# Patient Record
Sex: Female | Born: 1990 | Race: Black or African American | Hispanic: No | Marital: Single | Smoking: Current every day smoker
Health system: Southern US, Community
[De-identification: ages and names within clinical notes are randomized; demographics above are authoritative.]

## PROBLEM LIST (undated history)

## (undated) DIAGNOSIS — Z789 Other specified health status: Secondary | ICD-10-CM

## (undated) HISTORY — PX: OTHER SURGICAL HISTORY: SHX169

---

## 2017-11-03 ENCOUNTER — Emergency Department: Payer: Self-pay

## 2017-11-03 ENCOUNTER — Encounter: Payer: Self-pay | Admitting: Radiology

## 2017-11-03 ENCOUNTER — Inpatient Hospital Stay
Admission: EM | Admit: 2017-11-03 | Discharge: 2017-11-04 | DRG: 299 | Disposition: A | Discharge status: Home / Self Care | Payer: Self-pay | Attending: Internal Medicine | Admitting: Internal Medicine

## 2017-11-03 ENCOUNTER — Other Ambulatory Visit: Payer: Self-pay

## 2017-11-03 DIAGNOSIS — M7989 Other specified soft tissue disorders: Secondary | ICD-10-CM

## 2017-11-03 DIAGNOSIS — F17200 Nicotine dependence, unspecified, uncomplicated: Secondary | ICD-10-CM | POA: Diagnosis present

## 2017-11-03 DIAGNOSIS — I82451 Acute embolism and thrombosis of right peroneal vein: Secondary | ICD-10-CM

## 2017-11-03 DIAGNOSIS — M79604 Pain in right leg: Secondary | ICD-10-CM

## 2017-11-03 DIAGNOSIS — Z832 Family history of diseases of the blood and blood-forming organs and certain disorders involving the immune mechanism: Secondary | ICD-10-CM

## 2017-11-03 DIAGNOSIS — I2699 Other pulmonary embolism without acute cor pulmonale: Secondary | ICD-10-CM | POA: Diagnosis present

## 2017-11-03 DIAGNOSIS — I82491 Acute embolism and thrombosis of other specified deep vein of right lower extremity: Principal | ICD-10-CM | POA: Diagnosis present

## 2017-11-03 HISTORY — DX: Other specified health status: Z78.9

## 2017-11-03 LAB — COMPREHENSIVE METABOLIC PANEL
ALT: 19 U/L (ref 0–44)
AST: 20 U/L (ref 15–41)
Albumin: 3.5 g/dL (ref 3.5–5.0)
Alkaline Phosphatase: 44 U/L (ref 38–126)
Anion gap: 5 (ref 5–15)
BILIRUBIN TOTAL: 0.4 mg/dL (ref 0.3–1.2)
BUN: 6 mg/dL (ref 6–20)
CALCIUM: 8.8 mg/dL — AB (ref 8.9–10.3)
CO2: 24 mmol/L (ref 22–32)
Chloride: 108 mmol/L (ref 98–111)
Creatinine, Ser: 0.75 mg/dL (ref 0.44–1.00)
GFR calc Af Amer: >60 mL/min (ref >60)
Glucose, Bld: 92 mg/dL (ref 70–99)
Potassium: 3.4 mmol/L — ABNORMAL LOW (ref 3.5–5.1)
Sodium: 137 mmol/L (ref 135–145)
TOTAL PROTEIN: 6.9 g/dL (ref 6.5–8.1)

## 2017-11-03 LAB — URINALYSIS, COMPLETE (UACMP) WITH MICROSCOPIC
Bilirubin Urine: NEGATIVE
Glucose, UA: NEGATIVE mg/dL
KETONES UR: NEGATIVE mg/dL
Nitrite: NEGATIVE
Protein, ur: NEGATIVE mg/dL
SPECIFIC GRAVITY, URINE: 1.011 (ref 1.005–1.030)
pH: 7 (ref 5.0–8.0)

## 2017-11-03 LAB — CBC
HEMATOCRIT: 34.5 % — AB (ref 35.0–47.0)
HEMATOCRIT: 35.6 % (ref 35.0–47.0)
HEMOGLOBIN: 12.1 g/dL (ref 12.0–16.0)
Hemoglobin: 11.5 g/dL — ABNORMAL LOW (ref 12.0–16.0)
MCH: 28.9 pg (ref 26.0–34.0)
MCH: 29.4 pg (ref 26.0–34.0)
MCHC: 33.3 g/dL (ref 32.0–36.0)
MCHC: 33.9 g/dL (ref 32.0–36.0)
MCV: 86.6 fL (ref 80.0–100.0)
MCV: 86.6 fL (ref 80.0–100.0)
PLATELETS: 227 10*3/uL (ref 150–440)
Platelets: 214 10*3/uL (ref 150–440)
RBC: 3.98 MIL/uL (ref 3.80–5.20)
RBC: 4.11 MIL/uL (ref 3.80–5.20)
RDW: 13.1 % (ref 11.5–14.5)
RDW: 13.3 % (ref 11.5–14.5)
WBC: 6.5 10*3/uL (ref 3.6–11.0)
WBC: 6.9 10*3/uL (ref 3.6–11.0)

## 2017-11-03 LAB — CREATININE, SERUM
Creatinine, Ser: 0.71 mg/dL (ref 0.44–1.00)
GFR calc Af Amer: >60 mL/min (ref >60)
GFR calc non Af Amer: >60 mL/min (ref >60)

## 2017-11-03 LAB — PREGNANCY, URINE: PREG TEST UR: NEGATIVE

## 2017-11-03 MED ORDER — OXYCODONE-ACETAMINOPHEN 5-325 MG PO TABS: 1.0000 | ORAL_TABLET | Freq: Four times a day (QID) | ORAL | Status: DC | PRN

## 2017-11-03 MED ORDER — ACETAMINOPHEN 325 MG PO TABS: 650.0000 mg | ORAL_TABLET | Freq: Four times a day (QID) | ORAL | Status: DC | PRN

## 2017-11-03 MED ORDER — SODIUM CHLORIDE 0.9% FLUSH: 3.0000 mL | INTRAVENOUS | Status: DC | PRN

## 2017-11-03 MED ORDER — ONDANSETRON HCL 4 MG PO TABS: 4.0000 mg | ORAL_TABLET | Freq: Four times a day (QID) | ORAL | Status: DC | PRN

## 2017-11-03 MED ORDER — ENOXAPARIN SODIUM 100 MG/ML ~~LOC~~ SOLN
1.0000 mg/kg | Freq: Once | SUBCUTANEOUS | Status: AC
  Administered 2017-11-03: 85 mg via SUBCUTANEOUS
  Filled 2017-11-03: qty 0.85

## 2017-11-03 MED ORDER — ENOXAPARIN SODIUM 40 MG/0.4ML ~~LOC~~ SOLN: 40.0000 mg | SUBCUTANEOUS | Status: DC

## 2017-11-03 MED ORDER — IBUPROFEN 400 MG PO TABS: 400.0000 mg | ORAL_TABLET | Freq: Four times a day (QID) | ORAL | Status: DC | PRN

## 2017-11-03 MED ORDER — KETOROLAC TROMETHAMINE 15 MG/ML IJ SOLN
15.0000 mg | Freq: Four times a day (QID) | INTRAMUSCULAR | Status: DC | PRN
  Administered 2017-11-03 – 2017-11-04 (×2): 15 mg via INTRAVENOUS
  Filled 2017-11-03 (×2): qty 1

## 2017-11-03 MED ORDER — SODIUM CHLORIDE 0.9% FLUSH
3.0000 mL | Freq: Two times a day (BID) | INTRAVENOUS | Status: DC
  Administered 2017-11-03 – 2017-11-04 (×3): 3 mL via INTRAVENOUS

## 2017-11-03 MED ORDER — IOHEXOL 350 MG/ML SOLN
75.0000 mL | Freq: Once | INTRAVENOUS | Status: AC | PRN
  Administered 2017-11-03: 75 mL via INTRAVENOUS

## 2017-11-03 MED ORDER — ACETAMINOPHEN 650 MG RE SUPP: 650.0000 mg | Freq: Four times a day (QID) | RECTAL | Status: DC | PRN

## 2017-11-03 MED ORDER — ENOXAPARIN SODIUM 100 MG/ML ~~LOC~~ SOLN
1.0000 mg/kg | Freq: Two times a day (BID) | SUBCUTANEOUS | Status: DC
  Administered 2017-11-03 – 2017-11-04 (×2): 85 mg via SUBCUTANEOUS
  Filled 2017-11-03 (×3): qty 1

## 2017-11-03 MED ORDER — ONDANSETRON HCL 4 MG/2ML IJ SOLN: 4.0000 mg | Freq: Four times a day (QID) | INTRAMUSCULAR | Status: DC | PRN

## 2017-11-03 MED ORDER — SODIUM CHLORIDE 0.9 % IV SOLN: 250.0000 mL | INTRAVENOUS | Status: DC | PRN

## 2017-11-03 NOTE — ED Triage Notes (Signed)
Patient reports symptoms for "couple" days but worse tonight.  Reports pain and swelling to right calf area.  Patient is able to ambulate without assistance, but with slow gait.

## 2017-11-03 NOTE — H&P (Signed)
[employer] - [place] at [facility]   PATIENT NAME: Angelica Lara    MR#:  [id number]  DATE OF BIRTH:  1991-02-18  DATE OF ADMISSION:  11/03/2017  PRIMARY CARE PHYSICIAN: Patient, No Pcp Per   REQUESTING/REFERRING PHYSICIAN: Darci Current, MD   CHIEF COMPLAINT:   Chief Complaint  Patient presents with  . Leg Swelling    HISTORY OF PRESENT ILLNESS: Angelica Lara  is a 27 y.o. female with no medical history presenting with 1-1/2-week of right calf pain and right lower extremity swelling.  Patient also was having some chest pain and back pain ongoing for a very long time.  Patient was evaluated in the ED and had a lower extremity Doppler which showed DVT in the right lower extremity.  Patient does report that her mother had some sort of clotting problem.  She is unable to give me details on that.     PAST MEDICAL HISTORY:   Past Medical History:  Diagnosis Date  . Patient denies medical problems     PAST SURGICAL HISTORY:  Past Surgical History:  Procedure Laterality Date  . patient denies      SOCIAL HISTORY:  Social History   Tobacco Use  . Smoking status: Current Every Day Smoker  . Smokeless tobacco: Never Used  Substance Use Topics  . Alcohol use: Not Currently    FAMILY HISTORY:  Family History  Problem Relation Age of Onset  . Clotting disorder Mother     DRUG ALLERGIES: No Known Allergies  REVIEW OF SYSTEMS:   CONSTITUTIONAL: No fever, fatigue or weakness.  EYES: No blurred or double vision.  EARS, NOSE, AND THROAT: No tinnitus or ear pain.  RESPIRATORY: No cough, shortness of breath, wheezing or hemoptysis.  CARDIOVASCULAR: No chest pain, orthopnea, edema.  GASTROINTESTINAL: No nausea, vomiting, diarrhea or abdominal pain.  GENITOURINARY: No dysuria, hematuria.  ENDOCRINE: No polyuria, nocturia,  HEMATOLOGY: No anemia, easy bruising or bleeding SKIN: No rash or lesion. MUSCULOSKELETAL: No joint pain or arthritis.   Right lower extremity swelling NEUROLOGIC: No tingling, numbness, weakness.  PSYCHIATRY: No anxiety or depression.   MEDICATIONS AT HOME:  Prior to Admission medications   Not on File      PHYSICAL EXAMINATION:   VITAL SIGNS: Blood pressure 125/79, pulse 81, temperature 99.5 F (37.5 C), temperature source Oral, resp. rate 16, height 5\' 5"  (1.651 m), weight 86.2 kg (190 lb), last menstrual period 10/27/2017, SpO2 100 %.  GENERAL:  27 y.o.-year-old patient lying in the bed with no acute distress.  EYES: Pupils equal, round, reactive to light and accommodation. No scleral icterus. Extraocular muscles intact.  HEENT: Head atraumatic, normocephalic. Oropharynx and nasopharynx clear.  NECK:  Supple, no jugular venous distention. No thyroid enlargement, no tenderness.  LUNGS: Normal breath sounds bilaterally, no wheezing, rales,rhonchi or crepitation. No use of accessory muscles of respiration.  CARDIOVASCULAR: S1, S2 normal. No murmurs, rubs, or gallops.  ABDOMEN: Soft, nontender, nondistended. Bowel sounds present. No organomegaly or mass.  EXTREMITIES: right lower swelling NEUROLOGIC: Cranial nerves II through XII are intact. Muscle strength 5/5 in all extremities. Sensation intact. Gait not checked.  PSYCHIATRIC: The patient is alert and oriented x 3.  SKIN: No obvious rash, lesion, or ulcer.   LABORATORY PANEL:   CBC Recent Labs  Lab 11/03/17 0510  WBC 6.5  HGB 11.5*  HCT 34.5*  PLT 227  MCV 86.6  MCH 28.9  MCHC 33.3  RDW 13.1   ------------------------------------------------------------------------------------------------------------------  Chemistries  Recent  Labs  Lab 11/03/17 0510  NA 137  K 3.4*  CL 108  CO2 24  GLUCOSE 92  BUN 6  CREATININE 0.75  CALCIUM 8.8*  AST 20  ALT 19  ALKPHOS 44  BILITOT 0.4   ------------------------------------------------------------------------------------------------------------------ estimated creatinine clearance is  116.6 mL/min (by C-G formula based on SCr of 0.75 mg/dL). ------------------------------------------------------------------------------------------------------------------ No results for input(s): TSH, T4TOTAL, T3FREE, THYROIDAB in the last 72 hours.  Invalid input(s): FREET3   Coagulation profile No results for input(s): INR, PROTIME in the last 168 hours. ------------------------------------------------------------------------------------------------------------------- No results for input(s): DDIMER in the last 72 hours. -------------------------------------------------------------------------------------------------------------------  Cardiac Enzymes No results for input(s): CKMB, TROPONINI, MYOGLOBIN in the last 168 hours.  Invalid input(s): CK ------------------------------------------------------------------------------------------------------------------ Invalid input(s): POCBNP  ---------------------------------------------------------------------------------------------------------------  Urinalysis    Component Value Date/Time   COLORURINE YELLOW (A) 11/03/2017 0510   APPEARANCEUR CLOUDY (A) 11/03/2017 0510   LABSPEC 1.011 11/03/2017 0510   PHURINE 7.0 11/03/2017 0510   GLUCOSEU NEGATIVE 11/03/2017 0510   HGBUR SMALL (A) 11/03/2017 0510   BILIRUBINUR NEGATIVE 11/03/2017 0510   KETONESUR NEGATIVE 11/03/2017 0510   PROTEINUR NEGATIVE 11/03/2017 0510   NITRITE NEGATIVE 11/03/2017 0510   LEUKOCYTESUR LARGE (A) 11/03/2017 0510     RADIOLOGY: Ct Angio Chest Pe W And/or Wo Contrast  Result Date: 11/03/2017 CLINICAL DATA:  Right lower extremity DVT.  Chest pain. EXAM: CT ANGIOGRAPHY CHEST WITH CONTRAST TECHNIQUE: Multidetector CT imaging of the chest was performed using the standard protocol during bolus administration of intravenous contrast. Multiplanar CT image reconstructions and MIPs were obtained to evaluate the vascular anatomy. CONTRAST:  75mL OMNIPAQUE IOHEXOL  350 MG/ML SOLN COMPARISON:  None. FINDINGS: Cardiovascular: --Pulmonary arteries: Contrast injection is sufficient to demonstrate satisfactory opacification of the pulmonary arteries to the segmental level.Small right lower lobe segmental pulmonary embolus in the proximal lateral basal and posterior basal segmental arteries. No right heart strain. The main pulmonary artery is within normal limits for size. --Aorta: Limited opacification of the aorta due to bolus timing optimization for the pulmonary arteries. Conventional 3 vessel aortic branching pattern. The aortic course and caliber are normal. There is no aortic atherosclerosis. --Heart: Normal size. No pericardial effusion. Mediastinum/Nodes: No mediastinal, hilar or axillary lymphadenopathy. The visualized thyroid and thoracic esophageal course are unremarkable. Lungs/Pleura: No pulmonary nodules or masses. No pleural effusion or pneumothorax. No focal airspace consolidation. No focal pleural abnormality. Upper Abdomen: Contrast bolus timing is not optimized for evaluation of the abdominal organs. Within this limitation, the visualized organs of the upper abdomen are normal. Musculoskeletal: No chest wall abnormality. No acute or significant osseous findings. Review of the MIP images confirms the above findings. IMPRESSION: 1. Small right lower lobe pulmonary embolus within the lateral basal and posterior basal segmental branches. No right heart strain. 2. No other acute or focal thoracic abnormality. Critical Value/emergent results were called by telephone at the time of interpretation on 11/03/2017 at 6:18 am to Dr. Bayard Males , who verbally acknowledged these results. Electronically Signed   By: Deatra Robinson M.D.   On: 11/03/2017 06:19   US Venous Img Lower Unilateral Right  Result Date: 11/03/2017 CLINICAL DATA:  27 year old female with right lower extremity pain and swelling. EXAM: Right LOWER EXTREMITY VENOUS DOPPLER ULTRASOUND TECHNIQUE:  Gray-scale sonography with graded compression, as well as color Doppler and duplex ultrasound were performed to evaluate the lower extremity deep venous systems from the level of the common femoral vein and including the common femoral, femoral, profunda femoral, popliteal and calf  veins including the posterior tibial, peroneal and gastrocnemius veins when visible. The superficial great saphenous vein was also interrogated. Spectral Doppler was utilized to evaluate flow at rest and with distal augmentation maneuvers in the common femoral, femoral and popliteal veins. COMPARISON:  None. FINDINGS: Contralateral Common Femoral Vein: Respiratory phasicity is normal and symmetric with the symptomatic side. No evidence of thrombus. Normal compressibility. Common Femoral Vein: No evidence of thrombus. Normal compressibility, respiratory phasicity and response to augmentation. Saphenofemoral Junction: No evidence of thrombus. Normal compressibility and flow on color Doppler imaging. Profunda Femoral Vein: No evidence of thrombus. Normal compressibility and flow on color Doppler imaging. Femoral Vein: No evidence of thrombus. Normal compressibility, respiratory phasicity and response to augmentation. Popliteal Vein: No evidence of thrombus. Normal compressibility, respiratory phasicity and response to augmentation. Calf Veins: There is occlusive thrombus in the peroneal vein with noncompression of the vessel. Superficial Great Saphenous Vein: No evidence of thrombus. Normal compressibility. Venous Reflux:  None. Other Findings:  None. IMPRESSION: Occlusive DVT involving the peroneal vein. These results were called by telephone at the time of interpretation on 11/03/2017 at 3:55 am to Dr. Lucrezia EuropeALLISON WEBSTER , who verbally acknowledged these results. Electronically Signed   By: Elgie CollardArash  Radparvar M.D.   On: 11/03/2017 03:57    EKG: Orders placed or performed during the hospital encounter of 11/03/17  . EKG 12-Lead  . EKG  12-Lead  . ED EKG  . ED EKG    IMPRESSION AND PLAN: Pt is 27 y.o with no medical history with family history of clots presenting with swelling of the lower extremity as well as chest pain 1.  Acute pulmonary embolism and DVT we will treat patient with Lovenox and transition to oral Xarelto or Eliquis tomorrow I will check a hypercoagulable work-up in light of patient's family history of clots.  2.  Nicotine abuse 4 minutes spent smoking cessation provided strongly recommended that she stop smoking especially in light of an acute pulmonary embolism    All the records are reviewed and case discussed with ED provider. Management plans discussed with the patient, family and they are in agreement.  CODE STATUS:    TOTAL TIME TAKING CARE OF THIS PATIENT: 55 minutes.    Auburn BilberryShreyang Graysen Woodyard M.D on 11/03/2017 at 7:53 AM  Between 7am to 6pm - Pager - [phone]  After 6pm go to [email or web address] - password [email or web address] [facility]  [employer] Office  [phone]  CC: Primary care physician; Patient, No Pcp Per

## 2017-11-03 NOTE — ED Notes (Signed)
Pt transported to CT at this time.

## 2017-11-03 NOTE — ED Provider Notes (Addendum)
[facility] Emergency Department Provider Note   First MD Initiated Contact with Patient 11/03/17 [phone]     (approximate)  I have reviewed the triage vital signs and the nursing notes.   HISTORY  Chief Complaint Leg Swelling   HPI Angelica Lara is a 27 y.o. female presents to the emergency department with 1-1/2-week history of right calf pain and right lower externally swelling.  Patient states however that the pain is increased in intensity over the past 3 days current pain score 8 out of 10.  Patient denies any aggravating or alleviating factors.  Patient does admit to a familial history of blood clots stating that her mother had multiple strokes in her 3320s.  Patient does smoke no birth control use.  Patient admits to chest and back pain however no dyspnea.  Patient states that she has been having chest pain for "a very long time".   Past medical history None There are no active problems to display for this patient.     Prior to Admission medications   Not on File    Allergies No known allergies No family history on file.  Social History Social History   Tobacco Use  . Smoking status: Not on file  Substance Use Topics  . Alcohol use: Not on file  . Drug use: Not on file    Review of Systems Constitutional: No fever/chills Eyes: No visual changes. ENT: No sore throat. Cardiovascular: Denies chest pain. Respiratory: Denies shortness of breath. Gastrointestinal: No abdominal pain.  No nausea, no vomiting.  No diarrhea.  No constipation. Genitourinary: Negative for dysuria. Musculoskeletal: Negative for neck pain.  Negative for back pain.  Positive for right calf pain Integumentary: Negative for rash. Neurological: Negative for headaches, focal weakness or numbness.   ____________________________________________   PHYSICAL EXAM:  VITAL SIGNS: ED Triage Vitals  Enc Vitals Group     BP 11/03/17 0216 125/75     Pulse Rate  11/03/17 0216 93     Resp 11/03/17 0216 18     Temp 11/03/17 0216 99.5 F (37.5 C)     Temp Source 11/03/17 0216 Oral     SpO2 11/03/17 0216 100 %     Weight 11/03/17 0214 86.2 kg (190 lb)     Height 11/03/17 0214 1.651 m (5\' 5" )     Head Circumference --      Peak Flow --      Pain Score --      Pain Loc --      Pain Edu? --      Excl. in GC? --     Constitutional: Alert and oriented. Well appearing and in no acute distress. Eyes: Conjunctivae are normal.  Head: Atraumatic. Mouth/Throat: Mucous membranes are moist. Oropharynx non-erythematous. Neck: No stridor.   Cardiovascular: Normal rate, regular rhythm. Good peripheral circulation. Grossly normal heart sounds. Respiratory: Normal respiratory effort.  No retractions. Lungs CTAB. Gastrointestinal: Soft and nontender. No distention.  Musculoskeletal: 1+ nonpitting right lower extremity edema.  Right calf pain with palpation.  Palpable PT DP pulses equal bilaterally. Neurologic:  Normal speech and language. No gross focal neurologic deficits are appreciated.  Skin:  Skin is warm, dry and intact. No rash noted. Psychiatric: Mood and affect are normal. Speech and behavior are normal.  ____________________________________________   LABS (all labs ordered are listed, but only abnormal results are displayed)  Labs Reviewed  CBC - Abnormal; Notable for the following components:      Result Value  Hemoglobin 11.5 (*)    HCT 34.5 (*)    All other components within normal limits  COMPREHENSIVE METABOLIC PANEL - Abnormal; Notable for the following components:   Potassium 3.4 (*)    Calcium 8.8 (*)    All other components within normal limits  URINALYSIS, COMPLETE (UACMP) WITH MICROSCOPIC - Abnormal; Notable for the following components:   Color, Urine YELLOW (*)    APPearance CLOUDY (*)    Hgb urine dipstick SMALL (*)    Leukocytes, UA LARGE (*)    Bacteria, UA RARE (*)    All other components within normal limits    PREGNANCY, URINE    RADIOLOGY I, [place] N Mossie Gilder, personally viewed and evaluated these images (plain radiographs) as part of my medical decision making, as well as reviewing the written report by the radiologist.  ED MD interpretation: Occlusive DVT right peroneal vein.  Official radiology report(s): US Venous Img Lower Unilateral Right  Result Date: 11/03/2017 CLINICAL DATA:  27 year old female with right lower extremity pain and swelling. EXAM: Right LOWER EXTREMITY VENOUS DOPPLER ULTRASOUND TECHNIQUE: Gray-scale sonography with graded compression, as well as color Doppler and duplex ultrasound were performed to evaluate the lower extremity deep venous systems from the level of the common femoral vein and including the common femoral, femoral, profunda femoral, popliteal and calf veins including the posterior tibial, peroneal and gastrocnemius veins when visible. The superficial great saphenous vein was also interrogated. Spectral Doppler was utilized to evaluate flow at rest and with distal augmentation maneuvers in the common femoral, femoral and popliteal veins. COMPARISON:  None. FINDINGS: Contralateral Common Femoral Vein: Respiratory phasicity is normal and symmetric with the symptomatic side. No evidence of thrombus. Normal compressibility. Common Femoral Vein: No evidence of thrombus. Normal compressibility, respiratory phasicity and response to augmentation. Saphenofemoral Junction: No evidence of thrombus. Normal compressibility and flow on color Doppler imaging. Profunda Femoral Vein: No evidence of thrombus. Normal compressibility and flow on color Doppler imaging. Femoral Vein: No evidence of thrombus. Normal compressibility, respiratory phasicity and response to augmentation. Popliteal Vein: No evidence of thrombus. Normal compressibility, respiratory phasicity and response to augmentation. Calf Veins: There is occlusive thrombus in the peroneal vein with noncompression of the vessel.  Superficial Great Saphenous Vein: No evidence of thrombus. Normal compressibility. Venous Reflux:  None. Other Findings:  None. IMPRESSION: Occlusive DVT involving the peroneal vein. These results were called by telephone at the time of interpretation on 11/03/2017 at 3:55 am to Dr. Lucrezia Europe , who verbally acknowledged these results. Electronically Signed   By: Elgie Collard M.D.   On: 11/03/2017 03:57    ____________________________________________  ED ECG REPORT I, [place] N Barnard Sharps, the attending physician, personally viewed and interpreted this ECG.   Date: 11/03/2017  EKG Time: 6:25 AM  Rate: 90  Rhythm: Normal sinus rhythm  Axis: Normal  Intervals: Normal  ST&T Change: None   Procedures   ____________________________________________   INITIAL IMPRESSION / ASSESSMENT AND PLAN / ED COURSE  As part of my medical decision making, I reviewed the following data within the electronic MEDICAL RECORD NUMBER   27 year old female presenting with above-stated history and physical exam with concern for possible right lower extremity DVT and as such ultrasound was performed which revealed an occlusive right peroneal vein DVT.  Given patient's chest pain CT angiogram of the chest was performed to evaluate for pulmonary emboli.  CT of the chest revealed evidence of a right lower lobe segmental pulmonary emboli.  Patient given Lovenox 1  mg/kg.  Patient discussed with Dr. Sheryle Hail for hospital admission.     ____________________________________________  FINAL CLINICAL IMPRESSION(S) / ED DIAGNOSES  Final diagnoses:  Acute deep vein thrombosis (DVT) of right peroneal vein (HCC)  Acute pulmonary embolism without acute cor pulmonale, unspecified pulmonary embolism type (HCC)     MEDICATIONS GIVEN DURING THIS VISIT:  Medications - No data to display   ED Discharge Orders    None       Note:  This document was prepared using Dragon voice recognition software and may include  unintentional dictation errors.    Darci Current, MD 11/03/17 [id number]    Darci Current, MD 11/28/17 [phone]

## 2017-11-03 NOTE — Consult Note (Signed)
ANTICOAGULATION CONSULT NOTE - Initial Consult  Pharmacy Consult for enoxaparin Indication: pulmonary embolus and DVT  No Known Allergies  Patient Measurements: Height: 5\' 5"  (165.1 cm) Weight: 190 lb (86.2 kg) IBW/kg (Calculated) : 57 Heparin Dosing Weight:   Vital Signs: Temp: 99.5 F (37.5 C) (06/30 0216) Temp Source: Oral (06/30 0216) BP: 125/79 (06/30 0630) Pulse Rate: 81 (06/30 0630)  Labs: Recent Labs    11/03/17 0510  HGB 11.5*  HCT 34.5*  PLT 227  CREATININE 0.75    Estimated Creatinine Clearance: 116.6 mL/min (by C-G formula based on SCr of 0.75 mg/dL).   Medical History: Past Medical History:  Diagnosis Date  . Patient denies medical problems     Medications:  Scheduled:   Assessment: Patient is a 27 year old female found to have DVT and small PE. Pt given lovenox in the ED. Pharmacy consulted to continue lovenox. CBC WNL.  Goal of Therapy:   Monitor platelets by anticoagulation protocol: Yes   Plan:  Lovenox 1mg /kg q 12 hr- next dose @ 1830 tonight  Angelica Lara, Pharm.D, BCPS Clinical Pharmacist 11/03/2017,7:57 AM

## 2017-11-03 NOTE — ED Notes (Addendum)
ED MD states it is okay for pt to get up and walk, pt was unhooked from monitors and ambulatory to bathroom in room.

## 2017-11-04 LAB — CBC
HCT: 37.8 % (ref 35.0–47.0)
Hemoglobin: 12.8 g/dL (ref 12.0–16.0)
MCH: 29.2 pg (ref 26.0–34.0)
MCHC: 33.8 g/dL (ref 32.0–36.0)
MCV: 86.5 fL (ref 80.0–100.0)
PLATELETS: 223 10*3/uL (ref 150–440)
RBC: 4.37 MIL/uL (ref 3.80–5.20)
RDW: 13.2 % (ref 11.5–14.5)
WBC: 4.5 10*3/uL (ref 3.6–11.0)

## 2017-11-04 LAB — HIV ANTIBODY (ROUTINE TESTING W REFLEX): HIV Screen 4th Generation wRfx: NONREACTIVE

## 2017-11-04 LAB — LUPUS ANTICOAGULANT PANEL
DRVVT: 57.1 s — AB (ref 0.0–47.0)
PTT LA: 43.8 s (ref 0.0–51.9)

## 2017-11-04 LAB — BASIC METABOLIC PANEL
Anion gap: 6 (ref 5–15)
BUN: 8 mg/dL (ref 6–20)
CALCIUM: 9.1 mg/dL (ref 8.9–10.3)
CO2: 26 mmol/L (ref 22–32)
Chloride: 106 mmol/L (ref 98–111)
Creatinine, Ser: 0.79 mg/dL (ref 0.44–1.00)
GLUCOSE: 86 mg/dL (ref 70–99)
Potassium: 4.1 mmol/L (ref 3.5–5.1)
SODIUM: 138 mmol/L (ref 135–145)

## 2017-11-04 LAB — PROTEIN S ACTIVITY: Protein S Activity: 105 % (ref 63–140)

## 2017-11-04 LAB — HOMOCYSTEINE: HOMOCYSTEINE-NORM: 9.7 umol/L (ref 0.0–15.0)

## 2017-11-04 LAB — PROTEIN S, TOTAL: Protein S Ag, Total: 88 % (ref 60–150)

## 2017-11-04 LAB — DRVVT MIX: DRVVT MIX: 40.4 s (ref 0.0–47.0)

## 2017-11-04 LAB — PROTEIN C ACTIVITY: Protein C Activity: 143 % (ref 73–180)

## 2017-11-04 LAB — ANTITHROMBIN III: ANTITHROMB III FUNC: 95 % (ref 75–120)

## 2017-11-04 MED ORDER — APIXABAN 5 MG PO TABS: 10.0000 mg | ORAL_TABLET | Freq: Two times a day (BID) | ORAL | 0 refills | Status: AC

## 2017-11-04 MED ORDER — APIXABAN 5 MG PO TABS: 5.0000 mg | ORAL_TABLET | Freq: Two times a day (BID) | ORAL | 2 refills | Status: DC

## 2017-11-04 MED ORDER — APIXABAN 5 MG PO TABS: 10.0000 mg | ORAL_TABLET | Freq: Two times a day (BID) | ORAL | 0 refills | Status: DC

## 2017-11-04 MED ORDER — APIXABAN 5 MG PO TABS: 5.0000 mg | ORAL_TABLET | Freq: Two times a day (BID) | ORAL | 2 refills | Status: AC

## 2017-11-04 MED ORDER — OXYCODONE-ACETAMINOPHEN 5-325 MG PO TABS: 1.0000 | ORAL_TABLET | Freq: Three times a day (TID) | ORAL | 0 refills | Status: AC | PRN

## 2017-11-04 NOTE — Discharge Summary (Signed)
[facility] -  at [facility]  Angelica Lara, [place]27 y.o., DOB 1990/07/01, MRN [id number]. Admission date: 11/03/2017 Discharge Date 11/04/2017 Primary MD Patient, No Pcp Per Admitting Physician Auburn BilberryShreyang Sanaz Scarlett, MD  Admission Diagnosis  Right leg pain [M79.604] Right leg swelling [M79.89] Acute deep vein thrombosis (DVT) of right peroneal vein (HCC) [I82.491] Acute pulmonary embolism without acute cor pulmonale, unspecified pulmonary embolism type [facility](HCC) [I26.99]  Discharge Diagnosis   Active Problems: Acute pulmonary DVT Nicotine abuse     Hospital Course Angelica Lara  is a 27 y.o. female with no medical history presenting with 1-1/2-week of right calf pain and right lower extremity swelling.  Patient also was having some chest pain and back pain ongoing for a very long time.  Patient was evaluated in the ED and had a lower extremity Doppler which showed DVT in the right lower extremity.  Patient does report that her mother had some sort of clotting problem.    Patient was admitted and started on Lovenox.  Patient had hypercoagulable work-up which is currently pending.  She will need to follow-up with hematology as outpatient.  Patient is unable to afford her Eliquis I have contacted case manager who should be helping patient with this discharge medication.            Consults  None  Significant Tests:  See full reports for all details    Ct Angio Chest Pe W And/or Wo Contrast  Result Date: 11/03/2017 CLINICAL DATA:  Right lower extremity DVT.  Chest pain. EXAM: CT ANGIOGRAPHY CHEST WITH CONTRAST TECHNIQUE: Multidetector CT imaging of the chest was performed using the standard protocol during bolus administration of intravenous contrast. Multiplanar CT image reconstructions and MIPs were obtained to evaluate the vascular anatomy. CONTRAST:  75mL OMNIPAQUE IOHEXOL 350 MG/ML SOLN COMPARISON:  None. FINDINGS: Cardiovascular: --Pulmonary arteries:  Contrast injection is sufficient to demonstrate satisfactory opacification of the pulmonary arteries to the segmental level.Small right lower lobe segmental pulmonary embolus in the proximal lateral basal and posterior basal segmental arteries. No right heart strain. The main pulmonary artery is within normal limits for size. --Aorta: Limited opacification of the aorta due to bolus timing optimization for the pulmonary arteries. Conventional 3 vessel aortic branching pattern. The aortic course and caliber are normal. There is no aortic atherosclerosis. --Heart: Normal size. No pericardial effusion. Mediastinum/Nodes: No mediastinal, hilar or axillary lymphadenopathy. The visualized thyroid and thoracic esophageal course are unremarkable. Lungs/Pleura: No pulmonary nodules or masses. No pleural effusion or pneumothorax. No focal airspace consolidation. No focal pleural abnormality. Upper Abdomen: Contrast bolus timing is not optimized for evaluation of the abdominal organs. Within this limitation, the visualized organs of the upper abdomen are normal. Musculoskeletal: No chest wall abnormality. No acute or significant osseous findings. Review of the MIP images confirms the above findings. IMPRESSION: 1. Small right lower lobe pulmonary embolus within the lateral basal and posterior basal segmental branches. No right heart strain. 2. No other acute or focal thoracic abnormality. Critical Value/emergent results were called by telephone at the time of interpretation on 11/03/2017 at 6:18 am to Dr. Bayard MalesANDOLPH BROWN , who verbally acknowledged these results. Electronically Signed   By: Deatra RobinsonKevin  Herman M.D.   On: 11/03/2017 06:19   [place] Venous Img Lower Unilateral Right  Result Date: 11/03/2017 CLINICAL DATA:  27 year old female with right lower extremity pain and swelling. EXAM: Right LOWER EXTREMITY VENOUS DOPPLER ULTRASOUND TECHNIQUE: Gray-scale sonography with graded compression, as well as color Doppler and duplex  ultrasound were  performed to evaluate the lower extremity deep venous systems from the level of the common femoral vein and including the common femoral, femoral, profunda femoral, popliteal and calf veins including the posterior tibial, peroneal and gastrocnemius veins when visible. The superficial great saphenous vein was also interrogated. Spectral Doppler was utilized to evaluate flow at rest and with distal augmentation maneuvers in the common femoral, femoral and popliteal veins. COMPARISON:  None. FINDINGS: Contralateral Common Femoral Vein: Respiratory phasicity is normal and symmetric with the symptomatic side. No evidence of thrombus. Normal compressibility. Common Femoral Vein: No evidence of thrombus. Normal compressibility, respiratory phasicity and response to augmentation. Saphenofemoral Junction: No evidence of thrombus. Normal compressibility and flow on color Doppler imaging. Profunda Femoral Vein: No evidence of thrombus. Normal compressibility and flow on color Doppler imaging. Femoral Vein: No evidence of thrombus. Normal compressibility, respiratory phasicity and response to augmentation. Popliteal Vein: No evidence of thrombus. Normal compressibility, respiratory phasicity and response to augmentation. Calf Veins: There is occlusive thrombus in the peroneal vein with noncompression of the vessel. Superficial Great Saphenous Vein: No evidence of thrombus. Normal compressibility. Venous Reflux:  None. Other Findings:  None. IMPRESSION: Occlusive DVT involving the peroneal vein. These results were called by telephone at the time of interpretation on 11/03/2017 at 3:55 am to Dr. Lucrezia Europe , who verbally acknowledged these results. Electronically Signed   By: Elgie Collard M.D.   On: 11/03/2017 03:57       Today   Subjective:   Angelica Lara feeling better denies any complaints Objective:   Blood pressure (!) 104/55, pulse 66, temperature 98.2 F (36.8 C), resp. rate 18,  height 5\' 5"  (1.651 m), weight 86.2 kg (190 lb), last menstrual period 10/27/2017, SpO2 100 %.  .  Intake/Output Summary (Last 24 hours) at 11/04/2017 1401 Last data filed at 11/04/2017 1015 Gross per 24 hour  Intake 480 ml  Output -  Net 480 ml    Exam VITAL SIGNS: Blood pressure (!) 104/55, pulse 66, temperature 98.2 F (36.8 C), resp. rate 18, height 5\' 5"  (1.651 m), weight 86.2 kg (190 lb), last menstrual period 10/27/2017, SpO2 100 %.  GENERAL:  27 y.o.-year-old patient lying in the bed with no acute distress.  EYES: Pupils equal, round, reactive to light and accommodation. No scleral icterus. Extraocular muscles intact.  HEENT: Head atraumatic, normocephalic. Oropharynx and nasopharynx clear.  NECK:  Supple, no jugular venous distention. No thyroid enlargement, no tenderness.  LUNGS: Normal breath sounds bilaterally, no wheezing, rales,rhonchi or crepitation. No use of accessory muscles of respiration.  CARDIOVASCULAR: S1, S2 normal. No murmurs, rubs, or gallops.  ABDOMEN: Soft, nontender, nondistended. Bowel sounds present. No organomegaly or mass.  EXTREMITIES: No pedal edema, cyanosis, or clubbing.  NEUROLOGIC: Cranial nerves II through XII are intact. Muscle strength 5/5 in all extremities. Sensation intact. Gait not checked.  PSYCHIATRIC: The patient is alert and oriented x 3.  SKIN: No obvious rash, lesion, or ulcer.   Data Review     CBC w Diff:  Lab Results  Component Value Date   WBC 4.5 11/04/2017   HGB 12.8 11/04/2017   HCT 37.8 11/04/2017   PLT 223 11/04/2017   CMP:  Lab Results  Component Value Date   NA 138 11/04/2017   K 4.1 11/04/2017   CL 106 11/04/2017   CO2 26 11/04/2017   BUN 8 11/04/2017   CREATININE 0.79 11/04/2017   PROT 6.9 11/03/2017   ALBUMIN 3.5 11/03/2017   BILITOT 0.4 11/03/2017  ALKPHOS 44 11/03/2017   AST 20 11/03/2017   ALT 19 11/03/2017  .  Micro Results No results found for this or any previous visit (from the past 240  hour(s)).      Code Status Orders  (From admission, onward)        Start     Ordered   11/03/17 0831  Full code  Continuous     11/03/17 0830    Code Status History    This patient has a current code status but no historical code status.          Follow-up Information    Earna Coder, MD On 11/27/2017.   Specialties:  Internal Medicine, Oncology Why:  at 10am Contact information: [address] [place] [address] [phone]           Discharge Medications   Allergies as of 11/04/2017   No Known Allergies     Medication List    TAKE these medications   apixaban 5 MG Tabs tablet Commonly known as:  ELIQUIS Take 2 tablets (10 mg total) by mouth 2 (two) times daily for 7 days.   apixaban 5 MG Tabs tablet Commonly known as:  ELIQUIS Take 1 tablet (5 mg total) by mouth 2 (two) times daily. Start taking on:  11/12/2017   oxyCODONE-acetaminophen 5-325 MG tablet Commonly known as:  PERCOCET/ROXICET Take 1-2 tablets by mouth every 8 (eight) hours as needed for moderate pain.          Total Time in preparing paper work, data evaluation and todays exam - 35 minutes  Auburn Bilberry M.D on 11/04/2017 at 2:01 PM [facility]   Office  [phone]

## 2017-11-05 LAB — BETA-2-GLYCOPROTEIN I ABS, IGG/M/A: Beta-2 Glyco I IgG: <9 GPI IgG units (ref 0–20)

## 2017-11-05 LAB — CARDIOLIPIN ANTIBODIES, IGG, IGM, IGA: Anticardiolipin IgG: <9 GPL U/mL (ref 0–14)

## 2017-11-06 ENCOUNTER — Observation Stay
Admission: EM | Admit: 2017-11-06 | Discharge: 2017-11-07 | Disposition: A | Discharge status: Home / Self Care | Payer: Self-pay | Attending: Family Medicine | Admitting: Family Medicine

## 2017-11-06 ENCOUNTER — Other Ambulatory Visit: Payer: Self-pay

## 2017-11-06 ENCOUNTER — Emergency Department: Payer: Self-pay

## 2017-11-06 ENCOUNTER — Encounter: Payer: Self-pay | Admitting: Emergency Medicine

## 2017-11-06 DIAGNOSIS — G4453 Primary thunderclap headache: Principal | ICD-10-CM | POA: Insufficient documentation

## 2017-11-06 DIAGNOSIS — R519 Headache, unspecified: Secondary | ICD-10-CM | POA: Diagnosis present

## 2017-11-06 DIAGNOSIS — R739 Hyperglycemia, unspecified: Secondary | ICD-10-CM | POA: Insufficient documentation

## 2017-11-06 DIAGNOSIS — Z86711 Personal history of pulmonary embolism: Secondary | ICD-10-CM | POA: Insufficient documentation

## 2017-11-06 DIAGNOSIS — F17200 Nicotine dependence, unspecified, uncomplicated: Secondary | ICD-10-CM | POA: Insufficient documentation

## 2017-11-06 DIAGNOSIS — R740 Nonspecific elevation of levels of transaminase and lactic acid dehydrogenase [LDH]: Secondary | ICD-10-CM | POA: Insufficient documentation

## 2017-11-06 DIAGNOSIS — R51 Headache: Secondary | ICD-10-CM

## 2017-11-06 DIAGNOSIS — Z86718 Personal history of other venous thrombosis and embolism: Secondary | ICD-10-CM | POA: Insufficient documentation

## 2017-11-06 LAB — CBC WITH DIFFERENTIAL/PLATELET
BASOS PCT: 1 %
Basophils Absolute: 0 10*3/uL (ref 0–0.1)
EOS ABS: 0.1 10*3/uL (ref 0–0.7)
EOS PCT: 2 %
HCT: 38.5 % (ref 35.0–47.0)
HEMOGLOBIN: 12.7 g/dL (ref 12.0–16.0)
Lymphocytes Relative: 27 %
Lymphs Abs: 1.2 10*3/uL (ref 1.0–3.6)
MCH: 28.4 pg (ref 26.0–34.0)
MCHC: 33 g/dL (ref 32.0–36.0)
MCV: 86.2 fL (ref 80.0–100.0)
Monocytes Absolute: 0.6 10*3/uL (ref 0.2–0.9)
Monocytes Relative: 13 %
NEUTROS PCT: 57 %
Neutro Abs: 2.6 10*3/uL (ref 1.4–6.5)
Platelets: 266 10*3/uL (ref 150–440)
RBC: 4.46 MIL/uL (ref 3.80–5.20)
RDW: 13.2 % (ref 11.5–14.5)
WBC: 4.5 10*3/uL (ref 3.6–11.0)

## 2017-11-06 LAB — COMPREHENSIVE METABOLIC PANEL
ALK PHOS: 54 U/L (ref 38–126)
ALT: 35 U/L (ref 0–44)
AST: 43 U/L — ABNORMAL HIGH (ref 15–41)
Albumin: 4 g/dL (ref 3.5–5.0)
Anion gap: 5 (ref 5–15)
BILIRUBIN TOTAL: 0.7 mg/dL (ref 0.3–1.2)
BUN: 7 mg/dL (ref 6–20)
CO2: 28 mmol/L (ref 22–32)
CREATININE: 0.93 mg/dL (ref 0.44–1.00)
Calcium: 8.9 mg/dL (ref 8.9–10.3)
Chloride: 105 mmol/L (ref 98–111)
Glucose, Bld: 113 mg/dL — ABNORMAL HIGH (ref 70–99)
Potassium: 4 mmol/L (ref 3.5–5.1)
Sodium: 138 mmol/L (ref 135–145)
Total Protein: 8 g/dL (ref 6.5–8.1)

## 2017-11-06 LAB — PROTIME-INR
INR: 0.82
PROTHROMBIN TIME: 11.2 s — AB (ref 11.4–15.2)

## 2017-11-06 LAB — PROTEIN C, TOTAL: Protein C, Total: 116 % (ref 60–150)

## 2017-11-06 MED ORDER — PROCHLORPERAZINE EDISYLATE 10 MG/2ML IJ SOLN
10.0000 mg | Freq: Once | INTRAMUSCULAR | Status: AC
  Administered 2017-11-06: 10 mg via INTRAVENOUS
  Filled 2017-11-06: qty 2

## 2017-11-06 MED ORDER — IOHEXOL 350 MG/ML SOLN
75.0000 mL | Freq: Once | INTRAVENOUS | Status: AC | PRN
  Administered 2017-11-06: 75 mL via INTRAVENOUS

## 2017-11-06 MED ORDER — DEXAMETHASONE SODIUM PHOSPHATE 10 MG/ML IJ SOLN
10.0000 mg | Freq: Once | INTRAMUSCULAR | Status: AC
  Administered 2017-11-06: 10 mg via INTRAVENOUS
  Filled 2017-11-06: qty 1

## 2017-11-06 MED ORDER — ACETAMINOPHEN 500 MG PO TABS
1000.0000 mg | ORAL_TABLET | Freq: Once | ORAL | Status: AC
  Administered 2017-11-06: 1000 mg via ORAL
  Filled 2017-11-06: qty 2

## 2017-11-06 MED ORDER — SODIUM CHLORIDE 0.9 % IV SOLN
2.0000 g | Freq: Once | INTRAVENOUS | Status: AC
  Administered 2017-11-06: 2 g via INTRAVENOUS
  Filled 2017-11-06: qty 20

## 2017-11-06 MED ORDER — DIPHENHYDRAMINE HCL 50 MG/ML IJ SOLN
50.0000 mg | Freq: Once | INTRAMUSCULAR | Status: AC
Start: 2017-11-06 — End: 2017-11-06
  Administered 2017-11-06: 50 mg via INTRAVENOUS
  Filled 2017-11-06: qty 1

## 2017-11-06 NOTE — ED Provider Notes (Signed)
[facility] Emergency Department Provider Note  ____________________________________________   First MD Initiated Contact with Patient 11/06/17 2307     (approximate)  I have reviewed the triage vital signs and the nursing notes.   HISTORY  Chief Complaint Headache   HPI Angelica Lara is a 27 y.o. female comes to the emergency department with a bifrontal thunderclap headache unlike any headache she is ever had in her life that began roughly 12 hours ago.  Is associated with nausea but no vomiting.  No photophobia.  No history of migraines.  She does have a recent diagnosis of DVT and pulmonary embolism and recently begun taking Eliquis for anticoagulation.  She has a strong family history for blood clots.  She has not noted any fever or chills.  No coryza.  No cough.  No rash.  No abdominal pain.  Symptoms began suddenly were severe radiating across her forehead and nothing seems to make them better or worse.   Past Medical History:  Diagnosis Date  . Patient denies medical problems     Patient Active Problem List   Diagnosis Date Noted  . Severe frontal headaches 11/07/2017  . Pulmonary embolism (HCC) 11/03/2017    Past Surgical History:  Procedure Laterality Date  . patient denies      Prior to Admission medications   Medication Sig Start Date End Date Taking? Authorizing Provider  apixaban (ELIQUIS) 5 MG TABS tablet Take 2 tablets (10 mg total) by mouth 2 (two) times daily for 7 days. 11/04/17 11/11/17 Yes Auburn Bilberry, MD  oxyCODONE-acetaminophen (PERCOCET/ROXICET) 5-325 MG tablet Take 1-2 tablets by mouth every 8 (eight) hours as needed for moderate pain. 11/04/17  Yes Auburn Bilberry, MD  apixaban (ELIQUIS) 5 MG TABS tablet Take 1 tablet (5 mg total) by mouth 2 (two) times daily. 11/12/17   Auburn Bilberry, MD    Allergies Patient has no known allergies.  Family History  Problem Relation Age of Onset  . Clotting disorder Mother      Social History Social History   Tobacco Use  . Smoking status: Current Every Day Smoker  . Smokeless tobacco: Never Used  Substance Use Topics  . Alcohol use: Not Currently  . Drug use: Not Currently    Review of Systems Constitutional: No fever/chills Eyes: No visual changes. ENT: No sore throat. Cardiovascular: Denies chest pain. Respiratory: Denies shortness of breath. Gastrointestinal: No abdominal pain.  Positive for nausea, no vomiting.  No diarrhea.  No constipation. Genitourinary: Negative for dysuria. Musculoskeletal: Negative for back pain. Skin: Negative for rash. Neurological: Positive for headache   ____________________________________________   PHYSICAL EXAM:  VITAL SIGNS: ED Triage Vitals  Enc Vitals Group     BP 11/06/17 2305 129/75     Pulse Rate 11/06/17 2305 (!) 114     Resp 11/06/17 2305 18     Temp 11/06/17 2305 100.1 F (37.8 C)     Temp Source 11/06/17 2305 Oral     SpO2 11/06/17 2305 100 %     Weight 11/06/17 2306 190 lb (86.2 kg)     Height 11/06/17 2306 5\' 5"  (1.651 m)     Head Circumference --      Peak Flow --      Pain Score 11/06/17 2306 10     Pain Loc --      Pain Edu? --      Excl. in GC? --     Constitutional: Alert and oriented x4 appears extremely uncomfortable holding  her head and crying Eyes: PERRL EOMI. midrange and brisk Head: Atraumatic. Nose: No congestion/rhinnorhea. Mouth/Throat: No trismus Neck: No stridor.  No meningismus Cardiovascular: Tachycardic rate, regular rhythm. Grossly normal heart sounds.  Good peripheral circulation. Respiratory: Normal respiratory effort.  No retractions. Lungs CTAB and moving good air Gastrointestinal: Soft nontender Musculoskeletal: No lower extremity edema   Neurologic:  Normal speech and language. No gross focal neurologic deficits are appreciated. Skin:  Skin is warm, dry and intact. No rash noted. Psychiatric: Mood and affect are normal. Speech and behavior are  normal.    ____________________________________________   DIFFERENTIAL includes but not limited to  Meningitis, cerebral venous thrombosis, intracerebral hemorrhage, subarachnoid hemorrhage ____________________________________________   LABS (all labs ordered are listed, but only abnormal results are displayed)  Labs Reviewed  COMPREHENSIVE METABOLIC PANEL - Abnormal; Notable for the following components:      Result Value   Glucose, Bld 113 (*)    AST 43 (*)    All other components within normal limits  PROTIME-INR - Abnormal; Notable for the following components:   Prothrombin Time 11.2 (*)    All other components within normal limits  URINALYSIS, COMPLETE (UACMP) WITH MICROSCOPIC - Abnormal; Notable for the following components:   Color, Urine YELLOW (*)    APPearance CLEAR (*)    Specific Gravity, Urine 1.039 (*)    Hgb urine dipstick SMALL (*)    Leukocytes, UA MODERATE (*)    All other components within normal limits  ETHANOL  CBC WITH DIFFERENTIAL/PLATELET  POC URINE PREG, ED  POCT PREGNANCY, URINE    Lab work reviewed by me shows concentrated urine consistent with dehydration __________________________________________  EKG   ____________________________________________  RADIOLOGY  CT angios the head neck reviewed by me with no acute disease ____________________________________________   PROCEDURES  Procedure(s) performed: yes  Angiocath insertion Performed by: Merrily BrittleNeil Kearsten Ginther  Consent: Verbal consent obtained. Risks and benefits: risks, benefits and alternatives were discussed Time out: Immediately prior to procedure a "time out" was called to verify the correct patient, procedure, equipment, support staff and site/side marked as required.  Preparation: Patient was prepped and draped in the usual sterile fashion.  Vein Location: right AC  Ultrasound Guided  Gauge: 18  Normal blood return and flush without difficulty Patient tolerance:  Patient tolerated the procedure well with no immediate complications.     Procedures  Critical Care performed: no  ____________________________________________   INITIAL IMPRESSION / ASSESSMENT AND PLAN / ED COURSE  Pertinent labs & imaging results that were available during my care of the patient were reviewed by me and considered in my medical decision making (see chart for details).   The patient arrives with an oral temperature of 100.1 degrees along with a thunderclap headache.  Differential is broad but in the setting of new anticoagulation and concern for subarachnoid hemorrhage versus aneurysm etc.  She will require a CT angiogram of her head and neck.  IV Compazine and Benadryl for pain control now.  She is n.p.o.  I appreciate that she has no obvious meningismus but a new headache with a fever with no other clear symptoms raises concern for meningitis so I do think is reasonable to cover her with 2 g of ceftriaxone now.     Fortunately the patient's neuroimaging is reassuring and her headache is improved but not resolved.  At this point I do believe she requires inpatient admission as I am unable to perform a lumbar puncture secondary to her anticoagulation.  I  also have some concern over cerebral venous thrombosis etc.  I have discussed with the hospitalist who has graciously agreed to admit the patient to her service. ____________________________________________   FINAL CLINICAL IMPRESSION(S) / ED DIAGNOSES  Final diagnoses:  Primary thunderclap headache      NEW MEDICATIONS STARTED DURING THIS VISIT:  Current Discharge Medication List       Note:  This document was prepared using Dragon voice recognition software and may include unintentional dictation errors.     Merrily Brittle, MD 11/07/17 [phone]

## 2017-11-06 NOTE — ED Triage Notes (Signed)
Patient coming in for severe headache across the temple and came suddenly at noon today. Took percocet at 5pm today without any relief. Patient was just dc from hospital on Monday for DVT and PE and started on eliquis.

## 2017-11-07 ENCOUNTER — Encounter: Payer: Self-pay | Admitting: Internal Medicine

## 2017-11-07 ENCOUNTER — Telehealth: Payer: Self-pay

## 2017-11-07 ENCOUNTER — Other Ambulatory Visit: Payer: Self-pay

## 2017-11-07 DIAGNOSIS — R51 Headache: Secondary | ICD-10-CM

## 2017-11-07 DIAGNOSIS — R519 Headache, unspecified: Secondary | ICD-10-CM | POA: Diagnosis present

## 2017-11-07 LAB — URINALYSIS, COMPLETE (UACMP) WITH MICROSCOPIC
Bacteria, UA: NONE SEEN
Bilirubin Urine: NEGATIVE
Glucose, UA: NEGATIVE mg/dL
KETONES UR: NEGATIVE mg/dL
Nitrite: NEGATIVE
PROTEIN: NEGATIVE mg/dL
Specific Gravity, Urine: 1.039 — ABNORMAL HIGH (ref 1.005–1.030)
pH: 7 (ref 5.0–8.0)

## 2017-11-07 LAB — POCT PREGNANCY, URINE: PREG TEST UR: NEGATIVE

## 2017-11-07 LAB — ETHANOL: Alcohol, Ethyl (B): <10 mg/dL (ref <10)

## 2017-11-07 MED ORDER — APIXABAN 5 MG PO TABS
10.0000 mg | ORAL_TABLET | Freq: Two times a day (BID) | ORAL | Status: DC
  Administered 2017-11-07: 10 mg via ORAL
  Filled 2017-11-07: qty 2

## 2017-11-07 MED ORDER — ACETAMINOPHEN 650 MG RE SUPP: 650.0000 mg | Freq: Four times a day (QID) | RECTAL | Status: DC | PRN

## 2017-11-07 MED ORDER — ONDANSETRON HCL 4 MG/2ML IJ SOLN: 4.0000 mg | Freq: Four times a day (QID) | INTRAMUSCULAR | Status: DC | PRN

## 2017-11-07 MED ORDER — CEPHALEXIN 500 MG PO CAPS
500.0000 mg | ORAL_CAPSULE | Freq: Two times a day (BID) | ORAL | Status: DC
  Administered 2017-11-07: 500 mg via ORAL
  Filled 2017-11-07: qty 1

## 2017-11-07 MED ORDER — BISACODYL 5 MG PO TBEC: 5.0000 mg | DELAYED_RELEASE_TABLET | Freq: Every day | ORAL | Status: DC | PRN

## 2017-11-07 MED ORDER — HYDROMORPHONE HCL 1 MG/ML IJ SOLN
0.5000 mg | INTRAMUSCULAR | Status: DC | PRN
Start: 2017-11-07 — End: 2017-11-07

## 2017-11-07 MED ORDER — ACETAMINOPHEN 325 MG PO TABS: 650.0000 mg | ORAL_TABLET | Freq: Four times a day (QID) | ORAL | Status: DC | PRN

## 2017-11-07 MED ORDER — HYDROMORPHONE HCL 1 MG/ML IJ SOLN
1.0000 mg | INTRAMUSCULAR | Status: DC | PRN
Start: 2017-11-07 — End: 2017-11-07

## 2017-11-07 MED ORDER — SENNOSIDES-DOCUSATE SODIUM 8.6-50 MG PO TABS: 1.0000 | ORAL_TABLET | Freq: Every evening | ORAL | Status: DC | PRN

## 2017-11-07 MED ORDER — TOPIRAMATE 25 MG PO TABS
50.0000 mg | ORAL_TABLET | Freq: Two times a day (BID) | ORAL | Status: DC
  Administered 2017-11-07: 50 mg via ORAL
  Filled 2017-11-07 (×2): qty 2

## 2017-11-07 MED ORDER — TOPIRAMATE 50 MG PO TABS: 50.0000 mg | ORAL_TABLET | Freq: Two times a day (BID) | ORAL | 0 refills | Status: AC | PRN

## 2017-11-07 MED ORDER — ONDANSETRON HCL 4 MG PO TABS
4.0000 mg | ORAL_TABLET | Freq: Four times a day (QID) | ORAL | Status: DC | PRN
Start: 2017-11-07 — End: 2017-11-07

## 2017-11-07 MED ORDER — CEPHALEXIN 500 MG PO CAPS: 500.0000 mg | ORAL_CAPSULE | Freq: Two times a day (BID) | ORAL | 0 refills | Status: AC

## 2017-11-07 NOTE — Telephone Encounter (Signed)
EMMI Follow-up: Noted on the report that patient wasn't sure who to contact if there was a change in her condition.  Noted in EMR that CM, Judeth CornfieldStephanie had provided her with applications for [facility] and [facility]. I talked with Ms. Hyacinth MeekerMiller- Salomon FickBanks and she hadn't made an appointment yet with [facility] or [facility] as both are closed today but can call tomorrow. No needs noted today.

## 2017-11-07 NOTE — Discharge Summary (Signed)
[facility] -  at [facility]   PATIENT NAME: Angelica Lara    MR#:  [id number]  DATE OF BIRTH:  09/12/1990  DATE OF ADMISSION:  11/06/2017 ADMITTING PHYSICIAN: Barbaraann RondoPrasanna Sridharan, MD  DATE OF DISCHARGE: No discharge date for patient encounter.  PRIMARY CARE PHYSICIAN: Patient, No Pcp Per    ADMISSION DIAGNOSIS:  Headache  DISCHARGE DIAGNOSIS:  Active Problems:   Severe frontal headaches   SECONDARY DIAGNOSIS:   Past Medical History:  Diagnosis Date  . Patient denies medical problems     HOSPITAL COURSE:   *Acute headache Resolved CT head unimpressive Topamax as needed  *Acute possible UTI Resolving Keflex for 3-day course To follow-up with primary care provider in 2 to 3 days to establish care  *History of PE/DVT Stable Continue Eliquis  DISCHARGE CONDITIONS:  stable CONSULTS OBTAINED:  Treatment Team:  Barbaraann RondoSridharan, Prasanna, MD  DRUG ALLERGIES:  No Known Allergies  DISCHARGE MEDICATIONS:   Allergies as of 11/07/2017   No Known Allergies     Medication List    TAKE these medications   apixaban 5 MG Tabs tablet Commonly known as:  ELIQUIS Take 2 tablets (10 mg total) by mouth 2 (two) times daily for 7 days.   apixaban 5 MG Tabs tablet Commonly known as:  ELIQUIS Take 1 tablet (5 mg total) by mouth 2 (two) times daily. Start taking on:  11/12/2017   cephALEXin 500 MG capsule Commonly known as:  KEFLEX Take 1 capsule (500 mg total) by mouth every 12 (twelve) hours.   oxyCODONE-acetaminophen 5-325 MG tablet Commonly known as:  PERCOCET/ROXICET Take 1-2 tablets by mouth every 8 (eight) hours as needed for moderate pain.   topiramate 50 MG tablet Commonly known as:  TOPAMAX Take 1 tablet (50 mg total) by mouth 2 (two) times daily as needed (HA).        DISCHARGE INSTRUCTIONS:   If you experience worsening of your admission symptoms, develop shortness of breath, life threatening emergency, suicidal or  homicidal thoughts you must seek medical attention immediately by calling 911 or calling your MD immediately  if symptoms less severe.  You Must read complete instructions/literature along with all the possible adverse reactions/side effects for all the Medicines you take and that have been prescribed to you. Take any new Medicines after you have completely understood and accept all the possible adverse reactions/side effects.   Please note  You were cared for by a hospitalist during your hospital stay. If you have any questions about your discharge medications or the care you received while you were in the hospital after you are discharged, you can call the unit and asked to speak with the hospitalist on call if the hospitalist that took care of you is not available. Once you are discharged, your primary care physician will handle any further medical issues. Please note that NO REFILLS for any discharge medications will be authorized once you are discharged, as it is imperative that you return to your primary care physician (or establish a relationship with a primary care physician if you do not have one) for your aftercare needs so that they can reassess your need for medications and monitor your lab values.    Today   CHIEF COMPLAINT:   Chief Complaint  Patient presents with  . Headache    HISTORY OF PRESENT ILLNESS:  27 y.o. 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 Pt states that she woke up on  Wednesday (11/06/2017) morning feeling fine. @~1100AM, pt states she developed a mild frontal headache over the temples and anterior forehead. She states that the pain was mild enough that she was able to ignore it and go about her day. @~2130PM, pt states she went to sleep. She still had a mild headache at the time she went to sleep. She woke up @~2300PM w/ severe headache, unchanged in location/character but worsened in intensity, prompting hospitalization.  Recent admit 11/03/2017 for DVT/PE, (+) FHx coagulopathy, D/Ced 07/01 on Eliquis. Given Hx of familial clotting D/O, pt to be observed for advanced neuroimaging and management of symptoms.  Pt appears comfortable and is in no acute distress at the time of my assessment. She does not appear septic/toxic. She does not have meningismus/neck stiffness. She denies fever, chills, diaphoresis, rigors, night sweats, neck stiffness/pain, back pain, unilateral headache, photophobia/phonophobia, blurred vision, nausea/vomiting, acute changes in hearing/vision, vertigo, focal weakness, LH/LOC. Apart from HA, she is w/o complaint. AAOx3, neurologically non-focal. Had a low-grade temperature of 100.1 in ED, (-) WBC, SIRS (-). Low suspicion for acute bacterial/viral meningitis. CTA head/neck (-) aneurysm/bleed/dissection.     VITAL SIGNS:  Blood pressure 101/62, pulse 76, temperature 98.4 F (36.9 C), temperature source Oral, resp. rate 16, height 5\' 5"  (1.651 m), weight 90 kg (198 lb 6.4 oz), last menstrual period 10/27/2017, SpO2 100 %.  I/O:    Intake/Output Summary (Last 24 hours) at 11/07/2017 1019 Last data filed at 11/07/2017 0639 Gross per 24 hour  Intake 0 ml  Output -  Net 0 ml    PHYSICAL EXAMINATION:  GENERAL:  27 y.o.-year-old patient lying in the bed with no acute distress.  EYES: Pupils equal, round, reactive to light and accommodation. No scleral icterus. Extraocular muscles intact.  HEENT: Head atraumatic, normocephalic. Oropharynx and nasopharynx clear.  NECK:  Supple, no jugular venous distention. No thyroid enlargement, no tenderness.  LUNGS: Normal breath sounds bilaterally, no wheezing, rales,rhonchi or crepitation. No use of accessory muscles of respiration.  CARDIOVASCULAR: S1, S2 normal. No murmurs, rubs, or gallops.  ABDOMEN: Soft, non-tender, non-distended. Bowel sounds present. No organomegaly or mass.  EXTREMITIES: No pedal edema, cyanosis, or clubbing.  NEUROLOGIC: Cranial  nerves II through XII are intact. Muscle strength 5/5 in all extremities. Sensation intact. Gait not checked.  PSYCHIATRIC: The patient is alert and oriented x 3.  SKIN: No obvious rash, lesion, or ulcer.   DATA REVIEW:   CBC Recent Labs  Lab 11/06/17 2318  WBC 4.5  HGB 12.7  HCT 38.5  PLT 266    Chemistries  Recent Labs  Lab 11/06/17 2318  NA 138  K 4.0  CL 105  CO2 28  GLUCOSE 113*  BUN 7  CREATININE 0.93  CALCIUM 8.9  AST 43*  ALT 35  ALKPHOS 54  BILITOT 0.7    Cardiac Enzymes No results for input(s): TROPONINI in the last 168 hours.  Microbiology Results  No results found for this or any previous visit.  RADIOLOGY:  Ct Angio Head W Or Wo Contrast  Result Date: 11/07/2017 CLINICAL DATA:  27 y/o F; severe acute headache. Recent diagnosis of DVT and PE. EXAM: CT ANGIOGRAPHY HEAD AND NECK TECHNIQUE: Multidetector CT imaging of the head and neck was performed using the standard protocol during bolus administration of intravenous contrast. Multiplanar CT image reconstructions and MIPs were obtained to evaluate the vascular anatomy. Carotid stenosis measurements (when applicable) are obtained utilizing NASCET criteria, using the distal internal carotid diameter as the denominator. CONTRAST:  75mL OMNIPAQUE IOHEXOL 350 MG/ML SOLN COMPARISON:  None. FINDINGS: CT HEAD FINDINGS Brain: No evidence of acute infarction, hemorrhage, hydrocephalus, extra-axial collection or mass lesion/mass effect. Vascular: No hyperdense vessel or unexpected calcification. Skull: Normal. Negative for fracture or focal lesion. Sinuses: Imaged portions are clear. Orbits: No acute finding. Review of the MIP images confirms the above findings CTA NECK FINDINGS Aortic arch: Bovine variant branching. Imaged portion shows no evidence of aneurysm or dissection. No significant stenosis of the major arch vessel origins. Right carotid system: No evidence of dissection, stenosis (50% or greater) or occlusion. Left  carotid system: No evidence of dissection, stenosis (50% or greater) or occlusion. Vertebral arteries: Codominant. No evidence of dissection, stenosis (50% or greater) or occlusion. Skeleton: Negative. Other neck: 15 x 15 mm prominent lymph node in the right axilla, probably reactive. Upper chest: Negative. Review of the MIP images confirms the above findings CTA HEAD FINDINGS Anterior circulation: No significant stenosis, proximal occlusion, aneurysm, or vascular malformation. Posterior circulation: No significant stenosis, proximal occlusion, aneurysm, or vascular malformation. Venous sinuses: As permitted by contrast timing, patent. Anatomic variants: Complete circle-of-Willis. Delayed phase: No abnormal intracranial enhancement. Review of the MIP images confirms the above findings IMPRESSION: 1. Negative noncontrast CT of the head. No abnormal enhancement of the brain after contrast administration. 2. No large vessel occlusion, aneurysm, or hemodynamically significant stenosis by NASCET criteria. Electronically Signed   By: Mitzi Hansen M.D.   On: 11/07/2017 00:04   Ct Angio Neck W And/or Wo Contrast  Result Date: 11/07/2017 CLINICAL DATA:  27 y/o F; severe acute headache. Recent diagnosis of DVT and PE. EXAM: CT ANGIOGRAPHY HEAD AND NECK TECHNIQUE: Multidetector CT imaging of the head and neck was performed using the standard protocol during bolus administration of intravenous contrast. Multiplanar CT image reconstructions and MIPs were obtained to evaluate the vascular anatomy. Carotid stenosis measurements (when applicable) are obtained utilizing NASCET criteria, using the distal internal carotid diameter as the denominator. CONTRAST:  75mL OMNIPAQUE IOHEXOL 350 MG/ML SOLN COMPARISON:  None. FINDINGS: CT HEAD FINDINGS Brain: No evidence of acute infarction, hemorrhage, hydrocephalus, extra-axial collection or mass lesion/mass effect. Vascular: No hyperdense vessel or unexpected calcification.  Skull: Normal. Negative for fracture or focal lesion. Sinuses: Imaged portions are clear. Orbits: No acute finding. Review of the MIP images confirms the above findings CTA NECK FINDINGS Aortic arch: Bovine variant branching. Imaged portion shows no evidence of aneurysm or dissection. No significant stenosis of the major arch vessel origins. Right carotid system: No evidence of dissection, stenosis (50% or greater) or occlusion. Left carotid system: No evidence of dissection, stenosis (50% or greater) or occlusion. Vertebral arteries: Codominant. No evidence of dissection, stenosis (50% or greater) or occlusion. Skeleton: Negative. Other neck: 15 x 15 mm prominent lymph node in the right axilla, probably reactive. Upper chest: Negative. Review of the MIP images confirms the above findings CTA HEAD FINDINGS Anterior circulation: No significant stenosis, proximal occlusion, aneurysm, or vascular malformation. Posterior circulation: No significant stenosis, proximal occlusion, aneurysm, or vascular malformation. Venous sinuses: As permitted by contrast timing, patent. Anatomic variants: Complete circle-of-Willis. Delayed phase: No abnormal intracranial enhancement. Review of the MIP images confirms the above findings IMPRESSION: 1. Negative noncontrast CT of the head. No abnormal enhancement of the brain after contrast administration. 2. No large vessel occlusion, aneurysm, or hemodynamically significant stenosis by NASCET criteria. Electronically Signed   By: Mitzi Hansen M.D.   On: 11/07/2017 00:04    EKG:   Orders placed or  performed during the hospital encounter of 11/03/17  . EKG 12-Lead  . EKG 12-Lead  . ED EKG  . ED EKG  . EKG      Management plans discussed with the patient, family and they are in agreement.  CODE STATUS:     Code Status Orders  (From admission, onward)        Start     Ordered   11/07/17 0216  Full code  Continuous     11/07/17 0215    Code Status  History    Date Active Date Inactive Code Status Order ID Comments User Context   11/03/2017 0831 11/04/2017 1827 Full Code [id number]  Auburn Bilberry, MD Inpatient      TOTAL TIME TAKING CARE OF THIS PATIENT: 45 minutes.    Evelena Asa Salary M.D on 11/07/2017 at 10:19 AM  Between 7am to 6pm - Pager - [phone]  After 6pm go to [email or web address] - password [email or web address]  [facility]  Office  [phone]  CC: Primary care physician; Patient, No Pcp Per   Note: This dictation was prepared with Dragon dictation along with smaller phrase technology. Any transcriptional errors that result from this process are unintentional.

## 2017-11-07 NOTE — ED Notes (Signed)
Paged admitting doc about question that floor nurse had about isolation

## 2017-11-07 NOTE — Care Management (Signed)
Patient to discharge today on keflex and topamax.  Out of pocket cost $14 total for both medications.  [facility]  Medication Management  And [facility]  Applications provided.

## 2017-11-07 NOTE — H&P (Addendum)
[facility] [place] [facility]   PATIENT NAME: Angelica Lara    MR#:  [id number]  DATE OF BIRTH:  03-04-1991  DATE OF ADMISSION:  11/06/2017  PRIMARY CARE PHYSICIAN: Patient, No Pcp Per   REQUESTING/REFERRING PHYSICIAN: Merrily Brittleifenbark, Neil, MD  CHIEF COMPLAINT:   Chief Complaint  Patient presents with  . Headache    HISTORY OF PRESENT ILLNESS:  Angelica Lara  is a 27 y.o. 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 Pt states that she woke up on Wednesday (11/06/2017) morning feeling fine. @~1100AM, pt states she developed a mild frontal headache over the temples and anterior forehead. She states that the pain was mild enough that she was able to ignore it and go about her day. @~2130PM, pt states she went to sleep. She still had a mild headache at the time she went to sleep. She woke up @~2300PM w/ severe headache, unchanged in location/character but worsened in intensity, prompting hospitalization. Recent admit 11/03/2017 for DVT/PE, (+) FHx coagulopathy, D/Ced 07/01 on Eliquis. Given Hx of familial clotting D/O, pt to be observed for advanced neuroimaging and management of symptoms.  Pt appears comfortable and is in no acute distress at the time of my assessment. She does not appear septic/toxic. She does not have meningismus/neck stiffness. She denies fever, chills, diaphoresis, rigors, night sweats, neck stiffness/pain, back pain, unilateral headache, photophobia/phonophobia, blurred vision, nausea/vomiting, acute changes in hearing/vision, vertigo, focal weakness, LH/LOC. Apart from HA, she is w/o complaint. AAOx3, neurologically non-focal. Had a low-grade temperature of 100.1 in ED, (-) WBC, SIRS (-). Low suspicion for acute bacterial/viral meningitis. CTA head/neck (-) aneurysm/bleed/dissection.  PAST MEDICAL HISTORY:   Past Medical History:  Diagnosis Date  . Patient denies medical problems     PAST SURGICAL  HISTORY:   Past Surgical History:  Procedure Laterality Date  . patient denies      SOCIAL HISTORY:   Social History   Tobacco Use  . Smoking status: Current Every Day Smoker  . Smokeless tobacco: Never Used  Substance Use Topics  . Alcohol use: Not Currently    FAMILY HISTORY:   Family History  Problem Relation Age of Onset  . Clotting disorder Mother     DRUG ALLERGIES:  No Known Allergies  REVIEW OF SYSTEMS:   Review of Systems  Constitutional: Negative for chills, diaphoresis, fever, malaise/fatigue and weight loss.  HENT: Negative for congestion, ear pain, hearing loss, nosebleeds, sinus pain, sore throat and tinnitus.   Eyes: Negative for blurred vision, double vision and photophobia.  Respiratory: Negative for cough, hemoptysis, sputum production, shortness of breath and wheezing.   Cardiovascular: Negative for chest pain, palpitations, orthopnea, claudication, leg swelling and PND.  Gastrointestinal: Negative for abdominal pain, blood in stool, constipation, diarrhea, heartburn, melena, nausea and vomiting.  Genitourinary: Negative for dysuria, frequency, hematuria and urgency.  Musculoskeletal: Negative for back pain, joint pain, myalgias and neck pain.  Skin: Negative for itching and rash.  Neurological: Positive for headaches. Negative for dizziness, tingling, tremors, sensory change, speech change, focal weakness, seizures, loss of consciousness and weakness.  Psychiatric/Behavioral: Negative for memory loss. The patient does not have insomnia.    MEDICATIONS AT HOME:   Prior to Admission medications   Medication Sig Start Date End Date Taking? Authorizing Provider  apixaban (ELIQUIS) 5 MG TABS tablet Take 2 tablets (10 mg total) by mouth 2 (two) times daily for 7 days. 11/04/17 11/11/17 Yes Auburn BilberryPatel, Shreyang, MD  oxyCODONE-acetaminophen (PERCOCET/ROXICET) 5-325 MG tablet Take 1-2 tablets by mouth every 8 (eight) hours as needed for moderate pain. 11/04/17  Yes  A[place] Bilberry, MD  apixaban (ELIQUIS) 5 MG TABS tablet Take 1 tablet (5 mg total) by mouth 2 (two) times daily. 11/12/17   Auburn Bilberry, MD      VITAL SIGNS:  Blood pressure (!) 103/55, pulse 90, temperature 99 F (37.2 C), temperature source Oral, resp. rate 16, height 5\' 5"  (1.651 m), weight 86.2 kg (190 lb), last menstrual period 10/27/2017, SpO2 99 %.  PHYSICAL EXAMINATION:  Physical Exam  Constitutional: She is oriented to person, place, and time. She appears well-developed and well-nourished. She is active and cooperative.  Non-toxic appearance. She does not have a sickly appearance. She does not appear ill. No distress. She is not intubated.  HENT:  Head: Normocephalic and atraumatic.  Mouth/Throat: Oropharynx is clear and moist. No oropharyngeal exudate.  Eyes: Conjunctivae, EOM and lids are normal. No scleral icterus.  Neck: Normal range of motion. Neck supple. No JVD present. No neck rigidity. No Brudzinski's sign and no Kernig's sign noted. No thyromegaly present.  Cardiovascular: Normal rate, regular rhythm, S1 normal, S2 normal and normal heart sounds.  No extrasystoles are present. Exam reveals no gallop, no S3, no S4, no distant heart sounds and no friction rub.  No murmur heard. Pulmonary/Chest: Effort normal and breath sounds normal. No accessory muscle usage or stridor. No apnea, no tachypnea and no bradypnea. She is not intubated. No respiratory distress. She has no decreased breath sounds. She has no wheezes. She has no rhonchi. She has no rales.  Abdominal: Soft. Bowel sounds are normal. She exhibits no distension. There is no tenderness. There is no rebound and no guarding.  Musculoskeletal: Normal range of motion. She exhibits no edema or tenderness.  Lymphadenopathy:    She has no cervical adenopathy.  Neurological: She is alert and oriented to person, place, and time. She has normal strength and normal reflexes. She is not disoriented. She displays normal reflexes.  No cranial nerve deficit or sensory deficit. Coordination normal.  Skin: Skin is warm, dry and intact. No rash noted. She is not diaphoretic. No erythema.  Psychiatric: She has a normal mood and affect. Her speech is normal and behavior is normal. Judgment and thought content normal. Her mood appears not anxious. She is not agitated. Cognition and memory are normal.   Non-tachycardic on exam. LABORATORY PANEL:   CBC Recent Labs  Lab 11/06/17 2318  WBC 4.5  HGB 12.7  HCT 38.5  PLT 266   ------------------------------------------------------------------------------------------------------------------  Chemistries  Recent Labs  Lab 11/06/17 2318  NA 138  K 4.0  CL 105  CO2 28  GLUCOSE 113*  BUN 7  CREATININE 0.93  CALCIUM 8.9  AST 43*  ALT 35  ALKPHOS 54  BILITOT 0.7   ------------------------------------------------------------------------------------------------------------------  Cardiac Enzymes No results for input(s): TROPONINI in the last 168 hours. ------------------------------------------------------------------------------------------------------------------  RADIOLOGY:  Ct Angio Head W Or Wo Contrast  Result Date: 11/07/2017 CLINICAL DATA:  27 y/o F; severe acute headache. Recent diagnosis of DVT and PE. EXAM: CT ANGIOGRAPHY HEAD AND NECK TECHNIQUE: Multidetector CT imaging of the head and neck was performed using the standard protocol during bolus administration of intravenous contrast. Multiplanar CT image reconstructions and MIPs were obtained to evaluate the vascular anatomy. Carotid stenosis measurements (when applicable) are obtained utilizing NASCET criteria, using the distal internal carotid diameter as the denominator. CONTRAST:  75mL OMNIPAQUE IOHEXOL 350 MG/ML SOLN  COMPARISON:  None. FINDINGS: CT HEAD FINDINGS Brain: No evidence of acute infarction, hemorrhage, hydrocephalus, extra-axial collection or mass lesion/mass effect. Vascular: No hyperdense  vessel or unexpected calcification. Skull: Normal. Negative for fracture or focal lesion. Sinuses: Imaged portions are clear. Orbits: No acute finding. Review of the MIP images confirms the above findings CTA NECK FINDINGS Aortic arch: Bovine variant branching. Imaged portion shows no evidence of aneurysm or dissection. No significant stenosis of the major arch vessel origins. Right carotid system: No evidence of dissection, stenosis (50% or greater) or occlusion. Left carotid system: No evidence of dissection, stenosis (50% or greater) or occlusion. Vertebral arteries: Codominant. No evidence of dissection, stenosis (50% or greater) or occlusion. Skeleton: Negative. Other neck: 15 x 15 mm prominent lymph node in the right axilla, probably reactive. Upper chest: Negative. Review of the MIP images confirms the above findings CTA HEAD FINDINGS Anterior circulation: No significant stenosis, proximal occlusion, aneurysm, or vascular malformation. Posterior circulation: No significant stenosis, proximal occlusion, aneurysm, or vascular malformation. Venous sinuses: As permitted by contrast timing, patent. Anatomic variants: Complete circle-of-Willis. Delayed phase: No abnormal intracranial enhancement. Review of the MIP images confirms the above findings IMPRESSION: 1. Negative noncontrast CT of the head. No abnormal enhancement of the brain after contrast administration. 2. No large vessel occlusion, aneurysm, or hemodynamically significant stenosis by NASCET criteria. Electronically Signed   By: Mitzi Hansen M.D.   On: 11/07/2017 00:04   Ct Angio Neck W And/or Wo Contrast  Result Date: 11/07/2017 CLINICAL DATA:  27 y/o F; severe acute headache. Recent diagnosis of DVT and PE. EXAM: CT ANGIOGRAPHY HEAD AND NECK TECHNIQUE: Multidetector CT imaging of the head and neck was performed using the standard protocol during bolus administration of intravenous contrast. Multiplanar CT image reconstructions and MIPs  were obtained to evaluate the vascular anatomy. Carotid stenosis measurements (when applicable) are obtained utilizing NASCET criteria, using the distal internal carotid diameter as the denominator. CONTRAST:  75mL OMNIPAQUE IOHEXOL 350 MG/ML SOLN COMPARISON:  None. FINDINGS: CT HEAD FINDINGS Brain: No evidence of acute infarction, hemorrhage, hydrocephalus, extra-axial collection or mass lesion/mass effect. Vascular: No hyperdense vessel or unexpected calcification. Skull: Normal. Negative for fracture or focal lesion. Sinuses: Imaged portions are clear. Orbits: No acute finding. Review of the MIP images confirms the above findings CTA NECK FINDINGS Aortic arch: Bovine variant branching. Imaged portion shows no evidence of aneurysm or dissection. No significant stenosis of the major arch vessel origins. Right carotid system: No evidence of dissection, stenosis (50% or greater) or occlusion. Left carotid system: No evidence of dissection, stenosis (50% or greater) or occlusion. Vertebral arteries: Codominant. No evidence of dissection, stenosis (50% or greater) or occlusion. Skeleton: Negative. Other neck: 15 x 15 mm prominent lymph node in the right axilla, probably reactive. Upper chest: Negative. Review of the MIP images confirms the above findings CTA HEAD FINDINGS Anterior circulation: No significant stenosis, proximal occlusion, aneurysm, or vascular malformation. Posterior circulation: No significant stenosis, proximal occlusion, aneurysm, or vascular malformation. Venous sinuses: As permitted by contrast timing, patent. Anatomic variants: Complete circle-of-Willis. Delayed phase: No abnormal intracranial enhancement. Review of the MIP images confirms the above findings IMPRESSION: 1. Negative noncontrast CT of the head. No abnormal enhancement of the brain after contrast administration. 2. No large vessel occlusion, aneurysm, or hemodynamically significant stenosis by NASCET criteria. Electronically Signed    By: Mitzi Hansen M.D.   On: 11/07/2017 00:04   IMPRESSION AND PLAN:   A/P: 57F frontal headache, recent Dx DVT/PE (  on Eliquis). Also w/ mild hyperglycemia, transaminasemia. -Neurologically intact -CTA head/neck (-) aneurysm/bleed/dissection -SIRS (-), exam normal, pt appears well, low clinical suspicion for acute bacterial/viral meningitis -Obs Med/Surg -Symptomatic mgmt, pain ctrl -MRI/MRV brain pending -c/w Eliquis -AST 43, < 2-3x ULN. Monitor -Regular diet -Eliquis -Full code -Observation, < 2 midnights   All the records are reviewed and case discussed with ED provider. Management plans discussed with the patient, family and they are in agreement.  CODE STATUS: Full code  TOTAL TIME TAKING CARE OF THIS PATIENT: 75 minutes.    Barbaraann Rondo M.D on 11/07/2017 at 2:58 AM  Between 7am to 6pm - Pager - [phone]  After 6pm go to [email or web address] - Social research officer, government  [facility]  Office  [phone]  CC: Primary care physician; Patient, No Pcp Per   Note: This dictation was prepared with Dragon dictation along with smaller phrase technology. Any transcriptional errors that result from this process are unintentional.

## 2017-11-08 LAB — FACTOR 5 LEIDEN

## 2017-11-08 LAB — PROTHROMBIN GENE MUTATION

## 2017-11-27 ENCOUNTER — Inpatient Hospital Stay: Payer: Self-pay | Attending: Internal Medicine | Admitting: Internal Medicine

## 2017-11-27 NOTE — Progress Notes (Deleted)
[facility] CONSULT NOTE  Patient Care Team: Patient, No Pcp Per as PCP - General (General Practice)  CHIEF COMPLAINTS/PURPOSE OF CONSULTATION:  Pulmonary Embolus  # June 30th 2019- PULMONARY EMBOLUS- SMALL RIGHT LOWER/ DVT RIGHT DVT/Pernoeal-   #   No history exists.     HISTORY OF PRESENTING ILLNESS:  Angelica Lara 27 y.o.  female    Review of Systems  Constitutional: Negative for chills, diaphoresis, fever, malaise/fatigue and weight loss.  HENT: Negative for nosebleeds and sore throat.   Eyes: Negative for double vision.  Respiratory: Negative for cough, hemoptysis, sputum production, shortness of breath and wheezing.   Cardiovascular: Negative for chest pain, palpitations, orthopnea and leg swelling.  Gastrointestinal: Negative for abdominal pain, blood in stool, constipation, diarrhea, heartburn, melena, nausea and vomiting.  Genitourinary: Negative for dysuria, frequency and urgency.  Musculoskeletal: Negative for back pain and joint pain.  Skin: Negative.  Negative for itching and rash.  Neurological: Negative for dizziness, tingling, focal weakness, weakness and headaches.  Endo/Heme/Allergies: Does not bruise/bleed easily.  Psychiatric/Behavioral: Negative for depression. The patient is not nervous/anxious and does not have insomnia.      MEDICAL HISTORY:  Past Medical History:  Diagnosis Date  . Patient denies medical problems     SURGICAL HISTORY: Past Surgical History:  Procedure Laterality Date  . patient denies      SOCIAL HISTORY: Social History   Socioeconomic History  . Marital status: Single    Spouse name: Not on file  . Number of children: Not on file  . Years of education: Not on file  . Highest education level: Not on file  Occupational History  . Not on file  Social Needs  . Financial resource strain: Not on file  . Food insecurity:    Worry: Not on file    Inability: Not on file  . Transportation needs:     Medical: Not on file    Non-medical: Not on file  Tobacco Use  . Smoking status: Current Every Day Smoker  . Smokeless tobacco: Never Used  Substance and Sexual Activity  . Alcohol use: Not Currently  . Drug use: Not Currently  . Sexual activity: Not on file  Lifestyle  . Physical activity:    Days per week: Not on file    Minutes per session: Not on file  . Stress: Not on file  Relationships  . Social connections:    Talks on phone: Not on file    Gets together: Not on file    Attends religious service: Not on file    Active member of club or organization: Not on file    Attends meetings of clubs or organizations: Not on file    Relationship status: Not on file  . Intimate partner violence:    Fear of current or ex partner: Not on file    Emotionally abused: Not on file    Physically abused: Not on file    Forced sexual activity: Not on file  Other Topics Concern  . Not on file  Social History Narrative  . Not on file    FAMILY HISTORY: Family History  Problem Relation Age of Onset  . Clotting disorder Mother     ALLERGIES:  has No Known Allergies.  MEDICATIONS:  Current Outpatient Medications  Medication Sig Dispense Refill  . apixaban (ELIQUIS) 5 MG TABS tablet Take 2 tablets (10 mg total) by mouth 2 (two) times daily for 7 days. 28 tablet 0  .  apixaban (ELIQUIS) 5 MG TABS tablet Take 1 tablet (5 mg total) by mouth 2 (two) times daily. 60 tablet 2  . cephALEXin (KEFLEX) 500 MG capsule Take 1 capsule (500 mg total) by mouth every 12 (twelve) hours. 6 capsule 0  . oxyCODONE-acetaminophen (PERCOCET/ROXICET) 5-325 MG tablet Take 1-2 tablets by mouth every 8 (eight) hours as needed for moderate pain. 20 tablet 0  . topiramate (TOPAMAX) 50 MG tablet Take 1 tablet (50 mg total) by mouth 2 (two) times daily as needed (HA). 10 tablet 0   No current facility-administered medications for this visit.       Marland Kitchen.  PHYSICAL EXAMINATION: ECOG PERFORMANCE STATUS: {CHL ONC ECOG  PS:[phone]}  There were no vitals filed for this visit. There were no vitals filed for this visit.  Physical Exam   LABORATORY DATA:  I have reviewed the data as listed Lab Results  Component Value Date   WBC 4.5 11/06/2017   HGB 12.7 11/06/2017   HCT 38.5 11/06/2017   MCV 86.2 11/06/2017   PLT 266 11/06/2017   Recent Labs    11/03/17 0510 11/03/17 0946 11/04/17 0601 11/06/17 2318  NA 137  --  138 138  K 3.4*  --  4.1 4.0  CL 108  --  106 105  CO2 24  --  26 28  GLUCOSE 92  --  86 113*  BUN 6  --  8 7  CREATININE 0.75 0.71 0.79 0.93  CALCIUM 8.8*  --  9.1 8.9  GFRNONAA >60 >60 >60 >60  GFRAA >60 >60 >60 >60  PROT 6.9  --   --  8.0  ALBUMIN 3.5  --   --  4.0  AST 20  --   --  43*  ALT 19  --   --  35  ALKPHOS 44  --   --  54  BILITOT 0.4  --   --  0.7    RADIOGRAPHIC STUDIES: I have personally reviewed the radiological images as listed and agreed with the findings in the report. Ct Angio Head W Or Wo Contrast  Result Date: 11/07/2017 CLINICAL DATA:  27 y/o F; severe acute headache. Recent diagnosis of DVT and PE. EXAM: CT ANGIOGRAPHY HEAD AND NECK TECHNIQUE: Multidetector CT imaging of the head and neck was performed using the standard protocol during bolus administration of intravenous contrast. Multiplanar CT image reconstructions and MIPs were obtained to evaluate the vascular anatomy. Carotid stenosis measurements (when applicable) are obtained utilizing NASCET criteria, using the distal internal carotid diameter as the denominator. CONTRAST:  75mL OMNIPAQUE IOHEXOL 350 MG/ML SOLN COMPARISON:  None. FINDINGS: CT HEAD FINDINGS Brain: No evidence of acute infarction, hemorrhage, hydrocephalus, extra-axial collection or mass lesion/mass effect. Vascular: No hyperdense vessel or unexpected calcification. Skull: Normal. Negative for fracture or focal lesion. Sinuses: Imaged portions are clear. Orbits: No acute finding. Review of the MIP images confirms the above findings  CTA NECK FINDINGS Aortic arch: Bovine variant branching. Imaged portion shows no evidence of aneurysm or dissection. No significant stenosis of the major arch vessel origins. Right carotid system: No evidence of dissection, stenosis (50% or greater) or occlusion. Left carotid system: No evidence of dissection, stenosis (50% or greater) or occlusion. Vertebral arteries: Codominant. No evidence of dissection, stenosis (50% or greater) or occlusion. Skeleton: Negative. Other neck: 15 x 15 mm prominent lymph node in the right axilla, probably reactive. Upper chest: Negative. Review of the MIP images confirms the above findings CTA HEAD FINDINGS Anterior circulation:  No significant stenosis, proximal occlusion, aneurysm, or vascular malformation. Posterior circulation: No significant stenosis, proximal occlusion, aneurysm, or vascular malformation. Venous sinuses: As permitted by contrast timing, patent. Anatomic variants: Complete circle-of-Willis. Delayed phase: No abnormal intracranial enhancement. Review of the MIP images confirms the above findings IMPRESSION: 1. Negative noncontrast CT of the head. No abnormal enhancement of the brain after contrast administration. 2. No large vessel occlusion, aneurysm, or hemodynamically significant stenosis by NASCET criteria. Electronically Signed   By: Mitzi Hansen M.D.   On: 11/07/2017 00:04   Ct Angio Neck W And/or Wo Contrast  Result Date: 11/07/2017 CLINICAL DATA:  27 y/o F; severe acute headache. Recent diagnosis of DVT and PE. EXAM: CT ANGIOGRAPHY HEAD AND NECK TECHNIQUE: Multidetector CT imaging of the head and neck was performed using the standard protocol during bolus administration of intravenous contrast. Multiplanar CT image reconstructions and MIPs were obtained to evaluate the vascular anatomy. Carotid stenosis measurements (when applicable) are obtained utilizing NASCET criteria, using the distal internal carotid diameter as the denominator.  CONTRAST:  75mL OMNIPAQUE IOHEXOL 350 MG/ML SOLN COMPARISON:  None. FINDINGS: CT HEAD FINDINGS Brain: No evidence of acute infarction, hemorrhage, hydrocephalus, extra-axial collection or mass lesion/mass effect. Vascular: No hyperdense vessel or unexpected calcification. Skull: Normal. Negative for fracture or focal lesion. Sinuses: Imaged portions are clear. Orbits: No acute finding. Review of the MIP images confirms the above findings CTA NECK FINDINGS Aortic arch: Bovine variant branching. Imaged portion shows no evidence of aneurysm or dissection. No significant stenosis of the major arch vessel origins. Right carotid system: No evidence of dissection, stenosis (50% or greater) or occlusion. Left carotid system: No evidence of dissection, stenosis (50% or greater) or occlusion. Vertebral arteries: Codominant. No evidence of dissection, stenosis (50% or greater) or occlusion. Skeleton: Negative. Other neck: 15 x 15 mm prominent lymph node in the right axilla, probably reactive. Upper chest: Negative. Review of the MIP images confirms the above findings CTA HEAD FINDINGS Anterior circulation: No significant stenosis, proximal occlusion, aneurysm, or vascular malformation. Posterior circulation: No significant stenosis, proximal occlusion, aneurysm, or vascular malformation. Venous sinuses: As permitted by contrast timing, patent. Anatomic variants: Complete circle-of-Willis. Delayed phase: No abnormal intracranial enhancement. Review of the MIP images confirms the above findings IMPRESSION: 1. Negative noncontrast CT of the head. No abnormal enhancement of the brain after contrast administration. 2. No large vessel occlusion, aneurysm, or hemodynamically significant stenosis by NASCET criteria. Electronically Signed   By: Mitzi Hansen M.D.   On: 11/07/2017 00:04   Ct Angio Chest Pe W And/or Wo Contrast  Result Date: 11/03/2017 CLINICAL DATA:  Right lower extremity DVT.  Chest pain. EXAM: CT  ANGIOGRAPHY CHEST WITH CONTRAST TECHNIQUE: Multidetector CT imaging of the chest was performed using the standard protocol during bolus administration of intravenous contrast. Multiplanar CT image reconstructions and MIPs were obtained to evaluate the vascular anatomy. CONTRAST:  75mL OMNIPAQUE IOHEXOL 350 MG/ML SOLN COMPARISON:  None. FINDINGS: Cardiovascular: --Pulmonary arteries: Contrast injection is sufficient to demonstrate satisfactory opacification of the pulmonary arteries to the segmental level.Small right lower lobe segmental pulmonary embolus in the proximal lateral basal and posterior basal segmental arteries. No right heart strain. The main pulmonary artery is within normal limits for size. --Aorta: Limited opacification of the aorta due to bolus timing optimization for the pulmonary arteries. Conventional 3 vessel aortic branching pattern. The aortic course and caliber are normal. There is no aortic atherosclerosis. --Heart: Normal size. No pericardial effusion. Mediastinum/Nodes: No mediastinal, hilar or axillary  lymphadenopathy. The visualized thyroid and thoracic esophageal course are unremarkable. Lungs/Pleura: No pulmonary nodules or masses. No pleural effusion or pneumothorax. No focal airspace consolidation. No focal pleural abnormality. Upper Abdomen: Contrast bolus timing is not optimized for evaluation of the abdominal organs. Within this limitation, the visualized organs of the upper abdomen are normal. Musculoskeletal: No chest wall abnormality. No acute or significant osseous findings. Review of the MIP images confirms the above findings. IMPRESSION: 1. Small right lower lobe pulmonary embolus within the lateral basal and posterior basal segmental branches. No right heart strain. 2. No other acute or focal thoracic abnormality. Critical Value/emergent results were called by telephone at the time of interpretation on 11/03/2017 at 6:18 am to Dr. Bayard Males , who verbally acknowledged  these results. Electronically Signed   By: Deatra Robinson M.D.   On: 11/03/2017 06:19   US Venous Img Lower Unilateral Right  Result Date: 11/03/2017 CLINICAL DATA:  27 year old female with right lower extremity pain and swelling. EXAM: Right LOWER EXTREMITY VENOUS DOPPLER ULTRASOUND TECHNIQUE: Gray-scale sonography with graded compression, as well as color Doppler and duplex ultrasound were performed to evaluate the lower extremity deep venous systems from the level of the common femoral vein and including the common femoral, femoral, profunda femoral, popliteal and calf veins including the posterior tibial, peroneal and gastrocnemius veins when visible. The superficial great saphenous vein was also interrogated. Spectral Doppler was utilized to evaluate flow at rest and with distal augmentation maneuvers in the common femoral, femoral and popliteal veins. COMPARISON:  None. FINDINGS: Contralateral Common Femoral Vein: Respiratory phasicity is normal and symmetric with the symptomatic side. No evidence of thrombus. Normal compressibility. Common Femoral Vein: No evidence of thrombus. Normal compressibility, respiratory phasicity and response to augmentation. Saphenofemoral Junction: No evidence of thrombus. Normal compressibility and flow on color Doppler imaging. Profunda Femoral Vein: No evidence of thrombus. Normal compressibility and flow on color Doppler imaging. Femoral Vein: No evidence of thrombus. Normal compressibility, respiratory phasicity and response to augmentation. Popliteal Vein: No evidence of thrombus. Normal compressibility, respiratory phasicity and response to augmentation. Calf Veins: There is occlusive thrombus in the peroneal vein with noncompression of the vessel. Superficial Great Saphenous Vein: No evidence of thrombus. Normal compressibility. Venous Reflux:  None. Other Findings:  None. IMPRESSION: Occlusive DVT involving the peroneal vein. These results were called by telephone at  the time of interpretation on 11/03/2017 at 3:55 am to Dr. Lucrezia Europe , who verbally acknowledged these results. Electronically Signed   By: Elgie Collard M.D.   On: 11/03/2017 03:57    ASSESSMENT & PLAN:   No problem-specific Assessment & Plan notes found for this encounter.    All questions were answered. The patient knows to call the clinic with any problems, questions or concerns.       Earna Coder, MD 11/27/2017 8:29 AM

## 2019-09-16 IMAGING — CT CT ANGIO NECK
1 of 11 series · 6 of 33 positions shown · IV contrast (omnipaque)
Comparison: None.

CLINICAL DATA: 27 y/o F; severe acute headache. Recent diagnosis of
DVT and PE.

EXAM:
CT ANGIOGRAPHY HEAD AND NECK
TECHNIQUE: Multidetector CT imaging of the head and neck was performed using
the standard protocol during bolus administration of intravenous
contrast. Multiplanar CT image reconstructions and MIPs were
obtained to evaluate the vascular anatomy. Carotid stenosis
measurements (when applicable) are obtained utilizing NASCET
criteria, using the distal internal carotid diameter as the
denominator.
CONTRAST:  75mL OMNIPAQUE IOHEXOL 350 MG/ML SOLN

[Series 10: ax thin · axial · 0.40mm/px · z∈[+266,+507]mm · 6 of 340 slices shown]
[im 49/340  soft-tissue]
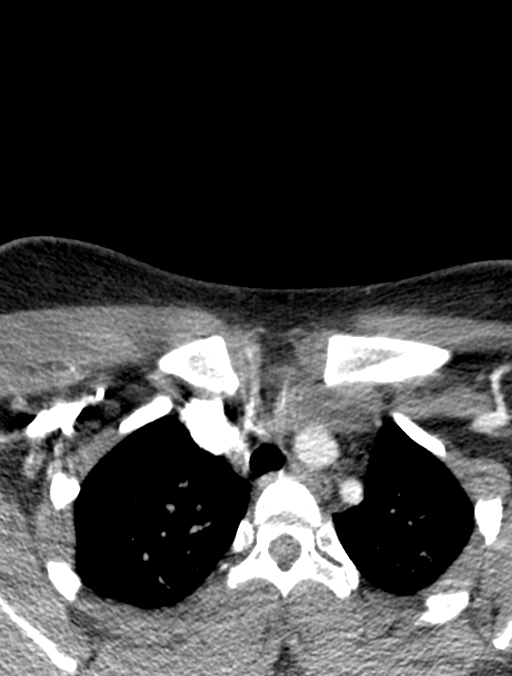
[im 97/340  bone]
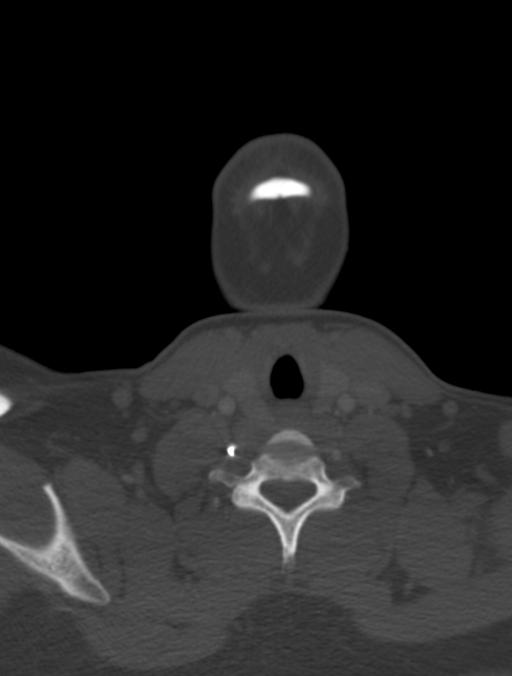
[im 146/340  soft-tissue]
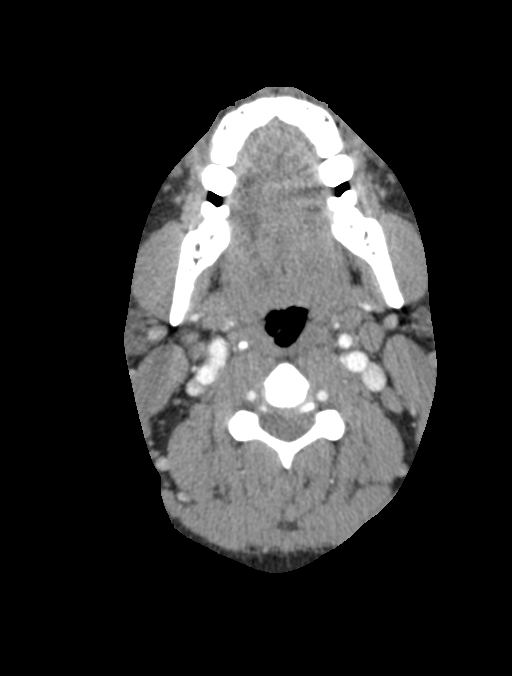
[im 194/340  bone]
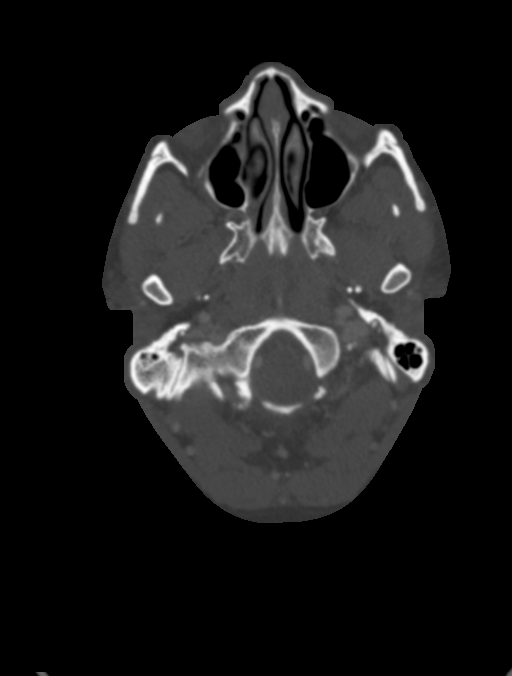
[im 243/340  soft-tissue]
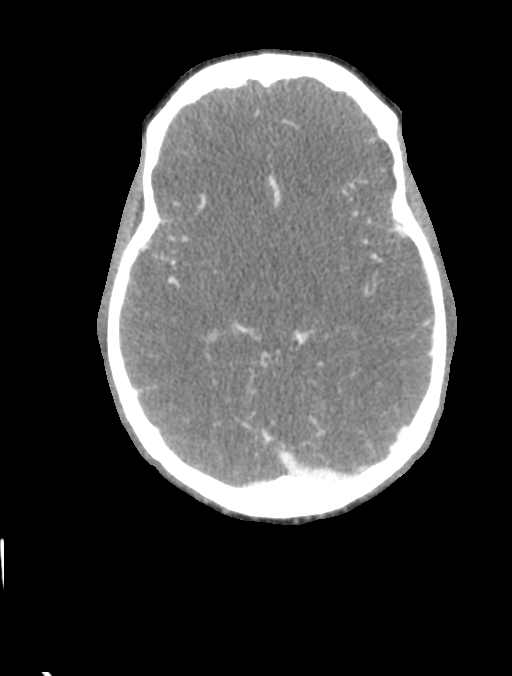
[im 291/340  bone]
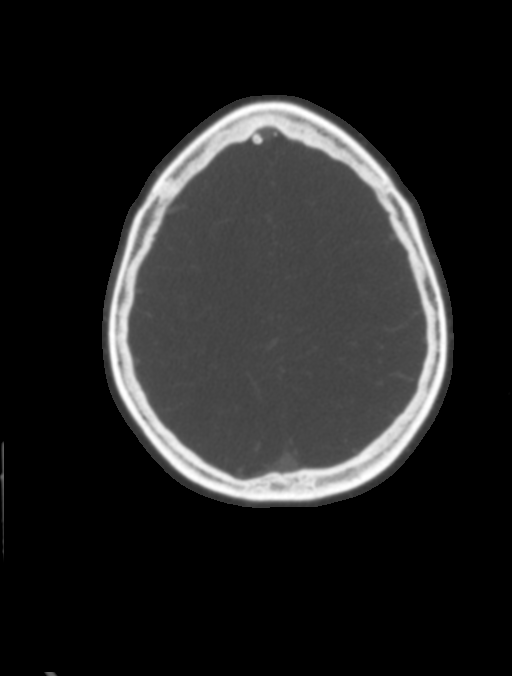

[6 of 33 positions shown; findings below may reference images not displayed]

FINDINGS: CT HEAD FINDINGS

Brain: No evidence of acute infarction, hemorrhage, hydrocephalus,
extra-axial collection or mass lesion/mass effect.

Vascular: No hyperdense vessel or unexpected calcification.

Skull: Normal. Negative for fracture or focal lesion.

Sinuses: Imaged portions are clear.

Orbits: No acute finding.

Review of the MIP images confirms the above findings

CTA NECK FINDINGS

Aortic arch: Bovine variant branching. Imaged portion shows no
evidence of aneurysm or dissection. No significant stenosis of the
major arch vessel origins.

Right carotid system: No evidence of dissection, stenosis (50% or
greater) or occlusion.

Left carotid system: No evidence of dissection, stenosis (50% or
greater) or occlusion.

Vertebral arteries: Codominant. No evidence of dissection, stenosis
(50% or greater) or occlusion.

Skeleton: Negative.

Other neck: 15 x 15 mm prominent lymph node in the right axilla,
probably reactive.

Upper chest: Negative.

Review of the MIP images confirms the above findings

CTA HEAD FINDINGS

Anterior circulation: No significant stenosis, proximal occlusion,
aneurysm, or vascular malformation.

Posterior circulation: No significant stenosis, proximal occlusion,
aneurysm, or vascular malformation.

Venous sinuses: As permitted by contrast timing, patent.

Anatomic variants: Complete circle-of-Willis.

Delayed phase: No abnormal intracranial enhancement.

Review of the MIP images confirms the above findings
IMPRESSION: 1. Negative noncontrast CT of the head. No abnormal enhancement of
the brain after contrast administration.
2. No large vessel occlusion, aneurysm, or hemodynamically
significant stenosis by NASCET criteria.

By: Nga Yu Geroche M.D.
